# Patient Record
Sex: Female | Born: 1950
Health system: Southern US, Community
[De-identification: ages and names within clinical notes are randomized; demographics above are authoritative.]

## PROBLEM LIST (undated history)

## (undated) DIAGNOSIS — T7840XA Allergy, unspecified, initial encounter: Secondary | ICD-10-CM

## (undated) DIAGNOSIS — A0472 Enterocolitis due to Clostridium difficile, not specified as recurrent: Secondary | ICD-10-CM

## (undated) DIAGNOSIS — I10 Essential (primary) hypertension: Secondary | ICD-10-CM

## (undated) HISTORY — DX: Essential (primary) hypertension: I10

## (undated) HISTORY — PX: TENDON REPAIR: SHX5111

## (undated) HISTORY — PX: LASIK: SHX215

## (undated) HISTORY — PX: REPLACEMENT TOTAL HIP W/  RESURFACING IMPLANTS: SUR1222

## (undated) HISTORY — PX: WISDOM TOOTH EXTRACTION: SHX21

## (undated) HISTORY — DX: Allergy, unspecified, initial encounter: T78.40XA

## (undated) HISTORY — PX: KNEE ARTHROSCOPY: SUR90

## (undated) HISTORY — PX: TONSILLECTOMY: SUR1361

## (undated) HISTORY — PX: CHOLECYSTECTOMY: SHX55

---
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
## 1980-10-26 HISTORY — PX: TENDON REPAIR: SHX5111

## 1990-10-26 HISTORY — PX: HYSTEROSCOPY WITH D & C: SHX1775

## 2009-02-27 HISTORY — PX: COLONOSCOPY: SHX174

## 2016-08-26 DIAGNOSIS — A0472 Enterocolitis due to Clostridium difficile, not specified as recurrent: Secondary | ICD-10-CM

## 2016-08-26 HISTORY — DX: Enterocolitis due to Clostridium difficile, not specified as recurrent: A04.72

## 2017-02-02 DIAGNOSIS — H938X2 Other specified disorders of left ear: Secondary | ICD-10-CM | POA: Insufficient documentation

## 2017-02-02 DIAGNOSIS — H9042 Sensorineural hearing loss, unilateral, left ear, with unrestricted hearing on the contralateral side: Secondary | ICD-10-CM | POA: Insufficient documentation

## 2017-02-02 DIAGNOSIS — H6122 Impacted cerumen, left ear: Secondary | ICD-10-CM | POA: Insufficient documentation

## 2019-08-16 ENCOUNTER — Other Ambulatory Visit: Payer: Self-pay

## 2019-08-16 ENCOUNTER — Encounter: Payer: Self-pay | Admitting: Podiatry

## 2019-08-16 ENCOUNTER — Ambulatory Visit (INDEPENDENT_AMBULATORY_CARE_PROVIDER_SITE_OTHER): Payer: Medicare Other | Admitting: Podiatry

## 2019-08-16 VITALS — BP 121/77 | HR 68 | Resp 16

## 2019-08-16 DIAGNOSIS — B351 Tinea unguium: Secondary | ICD-10-CM | POA: Diagnosis not present

## 2019-08-16 MED ORDER — TERBINAFINE HCL 250 MG PO TABS: ORAL_TABLET | ORAL | 0 refills | Status: DC

## 2019-08-16 NOTE — Progress Notes (Signed)
   Subjective:    Patient ID: Nicole Snow, female    DOB: 03/08/1951, 68 y.o.   MRN: [id number]  HPI    Review of Systems  All other systems reviewed and are negative.      Objective:   Physical Exam        Assessment & Plan:

## 2019-08-16 NOTE — Progress Notes (Signed)
Subjective:   Patient ID: Nicole Snow, female   DOB: 68 y.o.   MRN: [id number]   HPI Patient presents stating she is a discoloration of a number of nail beds and she is very active and likes to play sports.  Patient states they are not sore but she did lose her right big toenail last year and it been an ongoing problem and the other ones are discolored.  Patient does not smoke   Review of Systems  All other systems reviewed and are negative.       Objective:  Physical Exam Vitals signs and nursing note reviewed.  Constitutional:      Appearance: She is well-developed.  Pulmonary:     Effort: Pulmonary effort is normal.  Musculoskeletal: Normal range of motion.  Skin:    General: Skin is warm.  Neurological:     Mental Status: She is alert.     Neurovascular status intact muscle strength found to be adequate range of motion within normal limits.  Patient is noted to have discoloration of nail beds bilateral that are localized with thickness of the right hallux nail which is most likely trauma into orientation.  Patient is found to have good digital perfusion well oriented x3     Assessment:  Low-grade mycotic nail infection with trauma also as precipitating factor     Plan:  H&P all conditions discussed and at this point I did go ahead and I discussed consideration for pulse laser therapy in conjunction with Lamisil pulse and also topical medication.  Educated her on this she wants to go this route pictures taken today and scheduled for treatment with prescription sent to pharmacy

## 2019-08-25 ENCOUNTER — Other Ambulatory Visit: Payer: Self-pay

## 2019-08-25 DIAGNOSIS — B351 Tinea unguium: Secondary | ICD-10-CM

## 2019-09-01 ENCOUNTER — Encounter: Payer: Self-pay | Admitting: Internal Medicine

## 2019-09-01 ENCOUNTER — Other Ambulatory Visit: Payer: Self-pay

## 2019-09-01 ENCOUNTER — Ambulatory Visit (INDEPENDENT_AMBULATORY_CARE_PROVIDER_SITE_OTHER): Payer: Medicare Other | Admitting: Internal Medicine

## 2019-09-01 VITALS — BP 140/90 | HR 67 | Temp 97.8°F | Ht 68.0 in | Wt 118.9 lb

## 2019-09-01 DIAGNOSIS — H6122 Impacted cerumen, left ear: Secondary | ICD-10-CM | POA: Diagnosis not present

## 2019-09-01 DIAGNOSIS — H938X2 Other specified disorders of left ear: Secondary | ICD-10-CM | POA: Diagnosis not present

## 2019-09-01 DIAGNOSIS — Z1231 Encounter for screening mammogram for malignant neoplasm of breast: Secondary | ICD-10-CM

## 2019-09-01 DIAGNOSIS — Z1283 Encounter for screening for malignant neoplasm of skin: Secondary | ICD-10-CM

## 2019-09-01 NOTE — Addendum Note (Signed)
Addended by: Westley Hummer B on: 09/01/2019 10:19 AM   Modules accepted: Orders

## 2019-09-01 NOTE — Patient Instructions (Signed)
-  Nice seeing you today!!  -Schedule follow up visit for your wellness visit.  -Referral to dermatology placed today.

## 2019-09-01 NOTE — Progress Notes (Signed)
New Patient Office Visit     CC/Reason for Visit: Establish care, discuss chronic conditions Previous PCP: [facility] Last Visit: 2019  HPI: Barbe Thorup is a 68 y.o. female who is coming in today for the above mentioned reasons.  She has no past medical history of significance.  She does have some left hearing loss due to continuous cerumen impaction and is requesting ENT referral today.  She would also like a dermatology referral today for annual skin check.  She is retired, she is very healthy she plays pickle ball and lifts weights.  She and her husband moved from [place] to be closer to her daughter who teaches at [employer].  She is a never smoker, drinks 1-1/2 glasses of wine a night.  Family history significant for mother with Alzheimer's dementia and a father with CVA and COPD.  Past Medical/Surgical History: Past Medical History:  Diagnosis Date  . C. difficile colitis     Past Surgical History:  Procedure Laterality Date  . CHOLECYSTECTOMY    . REPLACEMENT TOTAL HIP W/  RESURFACING IMPLANTS      Social History:  reports that she has never smoked. She has never used smokeless tobacco. No history on file for alcohol and drug.  Allergies: Allergies  Allergen Reactions  . Ciprofloxacin Hives  . Clindamycin/Lincomycin Hives  . Flagyl [Metronidazole] Hives  . Hydrocodone Hives  . Penicillins     Family History:  Family History  Problem Relation Age of Onset  . Dementia Mother   . CVA Father   . COPD Father      Current Outpatient Medications:  .  CALCIUM PO, Take 1 tablet by mouth daily., Disp: , Rfl:  .  cetirizine (ZYRTEC) 10 MG tablet, Take by mouth., Disp: , Rfl:  .  fluticasone (FLONASE) 50 MCG/ACT nasal spray, Place into the nose., Disp: , Rfl:  .  Ginkgo 60 MG TABS, Take by mouth., Disp: , Rfl:  .  Multiple Vitamins-Minerals (EMERGEN-C IMMUNE PO), Take by mouth., Disp: , Rfl:  .  Probiotic Product (PROBIOTIC PO), Take  1 tablet by mouth daily., Disp: , Rfl:  .  terbinafine (LAMISIL) 250 MG tablet, Please take one a day x 7days, repeat every 4 weeks x 4 months, Disp: 28 tablet, Rfl: 0 .  Turmeric (QC TUMERIC COMPLEX PO), Take by mouth., Disp: , Rfl:   Review of Systems:  Constitutional: Denies fever, chills, diaphoresis, appetite change and fatigue.  HEENT: Denies photophobia, eye pain, redness, hearing loss, ear pain, congestion, sore throat, rhinorrhea, sneezing, mouth sores, trouble swallowing, neck pain, neck stiffness and tinnitus.   Respiratory: Denies SOB, DOE, cough, chest tightness,  and wheezing.   Cardiovascular: Denies chest pain, palpitations and leg swelling.  Gastrointestinal: Denies nausea, vomiting, abdominal pain, diarrhea, constipation, blood in stool and abdominal distention.  Genitourinary: Denies dysuria, urgency, frequency, hematuria, flank pain and difficulty urinating.  Endocrine: Denies: hot or cold intolerance, sweats, changes in hair or nails, polyuria, polydipsia. Musculoskeletal: Denies myalgias, back pain, joint swelling, arthralgias and gait problem.  Skin: Denies pallor, rash and wound.  Neurological: Denies dizziness, seizures, syncope, weakness, light-headedness, numbness and headaches.  Hematological: Denies adenopathy. Easy bruising, personal or family bleeding history  Psychiatric/Behavioral: Denies suicidal ideation, mood changes, confusion, nervousness, sleep disturbance and agitation    Physical Exam: Vitals:   09/01/19 0858  BP: 140/90  Pulse: 67  Temp: 97.8 F (36.6 C)  TempSrc: Temporal  SpO2: 98%  Weight: 118 lb  14.4 oz (53.9 kg)  Height: 5\' 8"  (1.727 m)   Body mass index is 18.08 kg/m.  Constitutional: NAD, calm, comfortable Eyes: PERRL, lids and conjunctivae normal, wears corrective lenses ENMT: Mucous membranes are moist.  Tympanic membrane is pearly white, no erythema or bulging on the right, left is obstructed by cerumen Neck: normal, supple, no  masses, no thyromegaly Respiratory: clear to auscultation bilaterally, no wheezing, no crackles. Normal respiratory effort. No accessory muscle use.  Cardiovascular: Regular rate and rhythm, no murmurs / rubs / gallops. No extremity edema. 2+ pedal pulses. No carotid bruits.  Abdomen: no tenderness, no masses palpated. No hepatosplenomegaly. Bowel sounds positive.  Skin: no rashes, lesions, ulcers. No induration Neurologic: Grossly intact and nonfocal Psychiatric: Normal judgment and insight. Alert and oriented x 3. Normal mood.    Impression and Plan:  Encounter for screening mammogram for malignant neoplasm of breast  - Plan: MM Digital Screening  Impacted cerumen of left ear Sensation of fullness in left ear -Cerumen Desimpaction  Warm water was applied and gentle ear lavage performed on left ear. There were no complications and following the desimpaction the tympanic membranes were visible. Tympanic membranes are intact following the procedure. Auditory canals are normal. The patient reported relief of symptoms after removal of cerumen.      Patient Instructions  -Nice seeing you today!!  -Schedule follow up visit for your wellness visit.  -Referral to dermatology placed today.     Lelon Frohlich, MD [facility]

## 2019-09-11 ENCOUNTER — Ambulatory Visit
Admission: RE | Admit: 2019-09-11 | Discharge: 2019-09-11 | Disposition: A | Discharge status: Home / Self Care | Payer: Medicare Other | Source: Ambulatory Visit | Attending: Internal Medicine | Admitting: Internal Medicine

## 2019-09-11 ENCOUNTER — Other Ambulatory Visit: Payer: Self-pay

## 2019-09-11 DIAGNOSIS — Z1231 Encounter for screening mammogram for malignant neoplasm of breast: Secondary | ICD-10-CM

## 2019-09-19 ENCOUNTER — Other Ambulatory Visit: Payer: Self-pay | Admitting: Internal Medicine

## 2019-09-19 ENCOUNTER — Encounter: Payer: Self-pay | Admitting: Internal Medicine

## 2019-09-19 DIAGNOSIS — R928 Other abnormal and inconclusive findings on diagnostic imaging of breast: Secondary | ICD-10-CM

## 2019-09-26 ENCOUNTER — Ambulatory Visit: Payer: Medicare Other

## 2019-09-26 ENCOUNTER — Other Ambulatory Visit: Payer: Self-pay

## 2019-09-26 ENCOUNTER — Ambulatory Visit
Admission: RE | Admit: 2019-09-26 | Discharge: 2019-09-26 | Disposition: A | Discharge status: Home / Self Care | Payer: Medicare Other | Source: Ambulatory Visit | Attending: Internal Medicine | Admitting: Internal Medicine

## 2019-09-26 DIAGNOSIS — R928 Other abnormal and inconclusive findings on diagnostic imaging of breast: Secondary | ICD-10-CM

## 2019-09-29 ENCOUNTER — Ambulatory Visit: Payer: Self-pay | Admitting: *Deleted

## 2019-09-29 ENCOUNTER — Other Ambulatory Visit: Payer: Self-pay

## 2019-09-29 DIAGNOSIS — B351 Tinea unguium: Secondary | ICD-10-CM

## 2019-09-29 NOTE — Patient Instructions (Signed)

## 2019-09-29 NOTE — Progress Notes (Signed)
Patient presents today for the 2nd laser treatment. Diagnosed with mycotic nail infection by Dr. Paulla Dolly. The hallux nail right is the most dystrophic.  All other systems are negative.  Nails were filed thin. Laser therapy was administered to 1-5 toenails bilateral and patient tolerated the treatment well. All safety precautions were in place.   Patient is also using a topical antifungal twice a day.  Follow up in 4 weeks for laser # 3.

## 2019-11-03 ENCOUNTER — Ambulatory Visit: Payer: Self-pay | Admitting: *Deleted

## 2019-11-03 ENCOUNTER — Other Ambulatory Visit: Payer: Self-pay

## 2019-11-03 DIAGNOSIS — B351 Tinea unguium: Secondary | ICD-10-CM

## 2019-11-03 NOTE — Progress Notes (Signed)
Patient presents today for the 3rd laser treatment. Diagnosed with mycotic nail infection by Dr. Paulla Dolly. Toenail most affected is the hallux nail right. She does see some clearing and is pleased.  All other systems are negative.  No filing was necessary today. Laser therapy was administered to 1-5 toenails bilateral and patient tolerated the treatment well. All safety precautions were in place.    Follow up in 4 weeks for laser # 4.  Picture of nails taken today to document visual progress

## 2019-11-13 ENCOUNTER — Ambulatory Visit: Payer: Medicare Other | Attending: Internal Medicine

## 2019-11-13 DIAGNOSIS — Z20822 Contact with and (suspected) exposure to covid-19: Secondary | ICD-10-CM

## 2019-11-14 ENCOUNTER — Ambulatory Visit: Payer: Medicare Other | Attending: Internal Medicine

## 2019-11-14 DIAGNOSIS — Z23 Encounter for immunization: Secondary | ICD-10-CM | POA: Insufficient documentation

## 2019-11-14 LAB — NOVEL CORONAVIRUS, NAA: SARS-CoV-2, NAA: NOT DETECTED

## 2019-11-14 NOTE — Progress Notes (Signed)
   Covid-19 Vaccination Clinic  Name:  Nicole Snow    MRN: [id number] DOB: [birth year]  11/14/2019  [name] was observed post Covid-19 immunization for 15 minutes without incidence. She was provided with Vaccine Information Sheet and instruction to access the V-Safe system.   [name] was instructed to call 911 with any severe reactions post vaccine: Marland Kitchen Difficulty breathing  . Swelling of your face and throat  . A fast heartbeat  . A bad rash all over your body  . Dizziness and weakness    Immunizations Administered    Name Date Dose VIS Date Route   Pfizer COVID-19 Vaccine 11/14/2019  4:55 PM 0.3 mL 10/06/2019 Intramuscular   Manufacturer: [place]   Lot: [id number]   [place]: [id number]

## 2019-12-02 ENCOUNTER — Ambulatory Visit: Payer: Medicare Other | Attending: Internal Medicine

## 2019-12-02 DIAGNOSIS — Z23 Encounter for immunization: Secondary | ICD-10-CM

## 2019-12-02 NOTE — Progress Notes (Signed)
   Covid-19 Vaccination Clinic  Name:  Lovia Ferrini    MRN: [id number] DOB: May 22, 1951  12/02/2019  Ms. Hollomon was observed post Covid-19 immunization for 30 minutes based on pre-vaccination screening without incidence. She was provided with Vaccine Information Sheet and instruction to access the V-Safe system.   Ms. Stuver was instructed to call 911 with any severe reactions post vaccine: Marland Kitchen Difficulty breathing  . Swelling of your face and throat  . A fast heartbeat  . A bad rash all over your body  . Dizziness and weakness    Immunizations Administered    Name Date Dose VIS Date Route   Pfizer COVID-19 Vaccine 12/02/2019 12:01 PM 0.3 mL 10/06/2019 Intramuscular   Manufacturer: [place]   Lot: [id number]   [place]: [id number]

## 2019-12-04 ENCOUNTER — Other Ambulatory Visit: Payer: Self-pay

## 2019-12-04 ENCOUNTER — Ambulatory Visit: Payer: Self-pay | Admitting: *Deleted

## 2019-12-04 DIAGNOSIS — B351 Tinea unguium: Secondary | ICD-10-CM

## 2019-12-04 NOTE — Progress Notes (Signed)
Patient presents today for the 4th laser treatment. Diagnosed with mycotic nail infection by Dr. Paulla Dolly. Toenail most affected is the hallux nail right. The hallux nail right is still dystrophic and the nail doesn't seem to be growing, however, the coloration of the nail is better.  All other systems are negative.  Hallux nail right was filed some today. Laser therapy was administered to 1-5 toenails bilateral and patient tolerated the treatment well. All safety precautions were in place.   She is still using a topical antifungal twice a day. She never took the terbinafine pulse dose that was prescribed last fall.  Follow up in 4 weeks for laser # 5.

## 2020-01-05 ENCOUNTER — Other Ambulatory Visit: Payer: Self-pay

## 2020-01-05 ENCOUNTER — Ambulatory Visit (INDEPENDENT_AMBULATORY_CARE_PROVIDER_SITE_OTHER): Payer: Medicare Other | Admitting: *Deleted

## 2020-01-05 DIAGNOSIS — B351 Tinea unguium: Secondary | ICD-10-CM

## 2020-01-05 NOTE — Progress Notes (Signed)
Patient presents today for the 5th laser treatment. Diagnosed with mycotic nail infection by Dr. Paulla Dolly. Toenail most affected is the hallux nail right.   The hallux nail right is still dystrophic and the nail doesn't seem to be growing, however, the coloration of the nail is better.   All other systems are negative.  Hallux nail right was filed some today. Laser therapy was administered to 1-5 toenails bilateral and patient tolerated the treatment well. All safety precautions were in place.   She is still using a topical antifungal twice a day. She never took the terbinafine pulse dose that was prescribed last fall.  Follow up in 8 weeks for laser # 6.  Take pic of nails next visit for final treatment

## 2020-02-07 ENCOUNTER — Encounter: Payer: Self-pay | Admitting: Internal Medicine

## 2020-02-07 ENCOUNTER — Ambulatory Visit (INDEPENDENT_AMBULATORY_CARE_PROVIDER_SITE_OTHER): Payer: Medicare Other | Admitting: Podiatry

## 2020-02-07 ENCOUNTER — Other Ambulatory Visit: Payer: Self-pay

## 2020-02-07 ENCOUNTER — Ambulatory Visit (INDEPENDENT_AMBULATORY_CARE_PROVIDER_SITE_OTHER): Payer: Medicare Other

## 2020-02-07 ENCOUNTER — Other Ambulatory Visit: Payer: Self-pay | Admitting: Podiatry

## 2020-02-07 ENCOUNTER — Encounter: Payer: Self-pay | Admitting: Podiatry

## 2020-02-07 VITALS — Temp 97.7°F

## 2020-02-07 DIAGNOSIS — M779 Enthesopathy, unspecified: Secondary | ICD-10-CM

## 2020-02-07 DIAGNOSIS — Z13820 Encounter for screening for osteoporosis: Secondary | ICD-10-CM

## 2020-02-07 DIAGNOSIS — M79672 Pain in left foot: Secondary | ICD-10-CM

## 2020-02-07 DIAGNOSIS — Z1211 Encounter for screening for malignant neoplasm of colon: Secondary | ICD-10-CM

## 2020-02-07 NOTE — Progress Notes (Signed)
Subjective:   Patient ID: Nicole Snow, female   DOB: 69 y.o.   MRN: [id number]   HPI Patient presents stating that the pain seems to be still present with moderate him provement over where it started but she is concerned about the discomfort as she is very active   ROS      Objective:  Physical Exam  Neurovascular status intact with patient noted to have inflammation forefoot left that is possibly a part of the problem and may be related to shoe gear that was changed in her activity levels     Assessment:  Acute inflammation of the lesser MPJ left mild to moderate in intensity     Plan:  H&P reviewed condition recommended change in shoe gear padding which was applied today oral anti-inflammatories to take and modified activity  X-rays indicate no signs of stress fracture arthritis associated with condition

## 2020-02-26 ENCOUNTER — Telehealth: Payer: Self-pay

## 2020-02-26 NOTE — Telephone Encounter (Signed)
Recv'd records from [facility] forwarded 8 pages to [facility] 5/3/21fbg

## 2020-03-01 ENCOUNTER — Other Ambulatory Visit: Payer: Self-pay

## 2020-03-01 ENCOUNTER — Encounter (INDEPENDENT_AMBULATORY_CARE_PROVIDER_SITE_OTHER): Payer: Self-pay | Admitting: Otolaryngology

## 2020-03-01 ENCOUNTER — Ambulatory Visit (INDEPENDENT_AMBULATORY_CARE_PROVIDER_SITE_OTHER): Payer: Medicare Other | Admitting: Otolaryngology

## 2020-03-01 ENCOUNTER — Other Ambulatory Visit: Payer: Medicare Other

## 2020-03-01 VITALS — Temp 97.9°F

## 2020-03-01 DIAGNOSIS — H6122 Impacted cerumen, left ear: Secondary | ICD-10-CM | POA: Diagnosis not present

## 2020-03-01 NOTE — Progress Notes (Signed)
HPI: Nicole Snow is a 69 y.o. female who presents for evaluation of ear wax buildup on the left side.  She has had this problem for several years and had previously seen ENT in another city.  She does not have problems with the right ear just the left ear.  She is always had a small left ear canal apparently she never had any previous surgery..  Past Medical History:  Diagnosis Date  . C. difficile colitis    Past Surgical History:  Procedure Laterality Date  . CHOLECYSTECTOMY    . REPLACEMENT TOTAL HIP W/  RESURFACING IMPLANTS     Social History   Socioeconomic History  . Marital status: Married    Spouse name: Not on file  . Number of children: Not on file  . Years of education: Not on file  . Highest education level: Not on file  Occupational History  . Not on file  Tobacco Use  . Smoking status: Never Smoker  . Smokeless tobacco: Never Used  Substance and Sexual Activity  . Alcohol use: Not on file  . Drug use: Not on file  . Sexual activity: Not on file  Other Topics Concern  . Not on file  Social History Narrative  . Not on file   Social Determinants of Health   Financial Resource Strain:   . Difficulty of Paying Living Expenses:   Food Insecurity:   . Worried About Charity fundraiser in the Last Year:   . Arboriculturist in the Last Year:   Transportation Needs:   . Film/video editor (Medical):   Marland Kitchen Lack of Transportation (Non-Medical):   Physical Activity:   . Days of Exercise per Week:   . Minutes of Exercise per Session:   Stress:   . Feeling of Stress :   Social Connections:   . Frequency of Communication with Friends and Family:   . Frequency of Social Gatherings with Friends and Family:   . Attends Religious Services:   . Active Member of Clubs or Organizations:   . Attends Archivist Meetings:   Marland Kitchen Marital Status:    Family History  Problem Relation Age of Onset  . Dementia Mother   . CVA Father   . COPD Father     Allergies  Allergen Reactions  . Ciprofloxacin Hives  . Clindamycin/Lincomycin Hives  . Flagyl [Metronidazole] Hives  . Hydrocodone Hives  . Penicillins    Prior to Admission medications   Medication Sig Start Date End Date Taking? Authorizing Provider  CALCIUM PO Take 1 tablet by mouth daily.   Yes [provider]  cetirizine (ZYRTEC) 10 MG tablet Take by mouth.   Yes [provider]  fluticasone (FLONASE) 50 MCG/ACT nasal spray Place into the nose.   Yes [provider]  Ginkgo 60 MG TABS Take by mouth.   Yes [provider]  Multiple Vitamins-Minerals (EMERGEN-C IMMUNE PO) Take by mouth.   Yes [provider]  Probiotic Product (PROBIOTIC PO) Take 1 tablet by mouth daily.   Yes [provider]  Turmeric (QC TUMERIC COMPLEX PO) Take by mouth.   Yes [provider]     Positive ROS: Otherwise negative  All other systems have been reviewed and were otherwise negative with the exception of those mentioned in the HPI and as above.  Physical Exam: Constitutional: Alert, well-appearing, no acute distress Ears: External ears without lesions or tenderness. Ear canals right ear canal and right  TM are clear.  Left ear canal is very narrow and use a nasal speculum in order to open up the ear canal and was able to remove cerumen with a curette.  Ear canal and TM otherwise clear.Marland Kitchen  Her hearing is very good. Nasal: External nose without lesions. Clear nasal passages Oral: Oropharynx clear. Neck: No palpable adenopathy or masses Respiratory: Breathing comfortably  Skin: No facial/neck lesions or rash noted.  Cerumen impaction removal  Date/Time: 03/01/2020 6:07 PM Performed by: Rozetta Nunnery, MD Authorized by: Rozetta Nunnery, MD   Consent:    Consent obtained:  Verbal   Consent given by:  Patient   Risks discussed:  Pain and bleeding Procedure details:    Location:  L ear   Procedure type: curette    Post-procedure details:    Inspection:  TM intact and canal normal   Hearing quality:  Improved   Patient tolerance of procedure:  Tolerated well, no immediate complications Comments:     Patient with a very narrowed left ear canal.  The TM is clear.    Assessment: Left ear cerumen buildup with very narrowed left ear canal  Plan: Briefly discussed possible surgical options to open up the ear canal but she is really not interested in surgery.  She generally only needs to have this cleaned about once a year.  Radene Journey, MD

## 2020-03-08 ENCOUNTER — Other Ambulatory Visit: Payer: Self-pay

## 2020-03-08 ENCOUNTER — Ambulatory Visit (INDEPENDENT_AMBULATORY_CARE_PROVIDER_SITE_OTHER): Payer: Medicare Other | Admitting: *Deleted

## 2020-03-08 DIAGNOSIS — B351 Tinea unguium: Secondary | ICD-10-CM

## 2020-03-08 NOTE — Progress Notes (Signed)
Patient presents today for the 6th laser treatment. Diagnosed with mycotic nail infection by Dr. Paulla Dolly. Toenail most affected is the hallux nail right.   The hallux nail right is still dystrophic and the nail doesn't seem to be growing, however, the coloration of the nail is better. She is pleased with the progress her nails have made so far.  All other systems are negative.  Hallux nail right was filed some today. Laser therapy was administered to 1-5 toenails bilateral and patient tolerated the treatment well. All safety precautions were in place.   She is still using a topical antifungal twice a day. She never took the terbinafine pulse dose that was prescribed last fall.  Patient has completed the recommended laser treatments. She will follow up with Dr. Paulla Dolly in 3 months to evaluate progress.   Final pic of nails were taken today

## 2020-03-18 ENCOUNTER — Telehealth: Payer: Self-pay | Admitting: Gastroenterology

## 2020-03-18 NOTE — Telephone Encounter (Signed)
She had a colonoscopy in 2010 with removal of small adenoma. She is due for surveillance, okay to direct book at [facility] if no major comorbidities. Thanks

## 2020-03-18 NOTE — Telephone Encounter (Signed)
DOD 02/12/20  Dr. Havery Moros, this patient was referred for a colonoscopy.  Her previous colon and path report will be sent to you for review.  Please advise scheduling.

## 2020-03-19 ENCOUNTER — Encounter: Payer: Self-pay | Admitting: Gastroenterology

## 2020-03-19 NOTE — Telephone Encounter (Signed)
Recv'd records from [facility]/Dr. Holtzmuller forwarded 22 pages to Dr. Nicki Guadalajara 5/25/21fbg

## 2020-03-21 ENCOUNTER — Ambulatory Visit (INDEPENDENT_AMBULATORY_CARE_PROVIDER_SITE_OTHER): Payer: Medicare Other | Admitting: Sports Medicine

## 2020-03-21 ENCOUNTER — Other Ambulatory Visit: Payer: Self-pay

## 2020-03-21 VITALS — BP 138/84 | Ht 68.0 in | Wt 115.0 lb

## 2020-03-21 DIAGNOSIS — M67952 Unspecified disorder of synovium and tendon, left thigh: Secondary | ICD-10-CM | POA: Diagnosis present

## 2020-03-21 NOTE — Progress Notes (Signed)
   Subjective:    Patient ID: Nicole Snow, female    DOB: [birth year], 69 y.o.   MRN: [id number]  HPI Nicole Snow is a 69 year old female with history if right hip replacement 6.5 years ago that presents to clinic for lateral left hip pain that started late last week.   She reports that the pain is in the lateral hip and describes it as a dull ache rather than a sharp or stabbing pain. It mostly bothers her at night when she is trying to sleep. The pain does not radiate but she does report tingling sensation to the knee. No groin pain associated. No swelling or known trauma. Reports that this pain occurs episodically every 4-5 years. Does not feel like the osteoarthritis she experienced in her right hip. Went to chiropractor on Monday and feels 75-80% improved.   Plays pickleball 4-5x per week and does strength training workouts at least 3 times weekly.  Played in a tournament on Tuesday without any issues. Has not had to take any pain medication.    Review of Systems As per HPI    Objective:   Physical Exam General: well-appearing, in no acute distress HEENT: atraumatic and normocephalic CV: no cyanosis, well perfused Resp: comfortable work of breathing  Left lower extremity: No tenderness to palpation of hip. No swelling or deformity. Mild limitation in internal range of motion of left hip. Good hip abductor strength. +Gluteal medius weakness.      Assessment & Plan:  Nicole Snow is a 69 year old female that presented to clinic with left lateral hip pain of 1 week duration that was improving prior to visit.   1. Tendinopathy of left gluteus medius Discussed that weakness of gluteus medius can result in hip pain Demonstrated pelvic floor strengthening exercises and provided handout Can continue pickle ball and strength training if asymptomatic  Will follow up in 6 weeks to see if symptoms improved and if not, determine if imaging is warranted  Dorna Leitz,  MD [facility] PGY-3  Patient seen and evaluated with the resident.  I agree with the above plan of care.  Although the patient may have some arthritis in the left hip given her decrease in internal rotation, I believe her pain is originating from a week left gluteus medius tendon.  She will start pelvic stabilizer exercises and will follow up in 6 weeks.  Of note, she is status post right hip arthroplasty several years ago and states that her current left hip pain is different in nature than what she experienced with the arthritis in the right hip.

## 2020-04-30 ENCOUNTER — Other Ambulatory Visit: Payer: Self-pay

## 2020-04-30 ENCOUNTER — Ambulatory Visit (INDEPENDENT_AMBULATORY_CARE_PROVIDER_SITE_OTHER): Payer: Medicare Other | Admitting: Sports Medicine

## 2020-04-30 ENCOUNTER — Ambulatory Visit (AMBULATORY_SURGERY_CENTER): Payer: Self-pay | Admitting: *Deleted

## 2020-04-30 VITALS — BP 136/84 | Ht 67.0 in | Wt 114.0 lb

## 2020-04-30 VITALS — Ht 67.0 in | Wt 113.6 lb

## 2020-04-30 DIAGNOSIS — M67952 Unspecified disorder of synovium and tendon, left thigh: Secondary | ICD-10-CM | POA: Insufficient documentation

## 2020-04-30 DIAGNOSIS — Z86010 Personal history of colonic polyps: Secondary | ICD-10-CM

## 2020-04-30 MED ORDER — SUTAB 1479-225-188 MG PO TABS: 1.0000 | ORAL_TABLET | ORAL | 0 refills | Status: DC

## 2020-04-30 MED ORDER — METHOCARBAMOL 500 MG PO TABS: 500.0000 mg | ORAL_TABLET | Freq: Every day | ORAL | 1 refills | Status: DC

## 2020-04-30 NOTE — Patient Instructions (Addendum)
Thank you for coming in to see [place] today! Please see below to review our plan for today's visit:  1. Physical Therapy 2. Take muscle relaxer Robaxin 500mg  every night before bed.  3. Reduce Pickle ball to every other day. 4. Follow up in 4 weeks  Please call the clinic at [phone] if your symptoms worsen or you have any concerns. It was our pleasure to serve you!  Dr. Everlean Alstrom Dr. Milus Banister [facility]

## 2020-04-30 NOTE — Progress Notes (Signed)
Patient denies any allergies to egg or soy products. Patient denies complications with anesthesia/sedation.  Patient denies oxygen use at home and denies diet medications. Emmi instructions for colonoscopy explained and given to patient.  Patient had both covid vaccinations.  

## 2020-04-30 NOTE — Progress Notes (Signed)
°  Nicole Snow - 69 y.o. female MRN [id number]  Date of birth: 02/21/1951    SUBJECTIVE:      Chief Complaint:/ HPI:  This is a pleasant patient presenting for follow-up of her left sided hip pain due to left gluteus medius tendinopathy.  She reports the left-sided hip pain is often waking up at night around 3:00 every morning.  She also occasionally has left IT band pain and left hip pain that radiates towards the groin.  Denies any loss of bladder or bowel function.  She reports that she feels about 10% better since her previous visit.  Since her last visit she has been performing her at home exercises.  She also recently made the decision to decrease her pickleball playing from daily to every other day.  She got a massage about 1 week ago reports she felt great for about 24 hours.  Sitting still and sleeping makes the left hip pain worse, hip pain is reduced with exercise.   ROS:     See HPI  PERTINENT  PMH / PSH / FH / SH:  Past Medical, Surgical, Social, and Family History Reviewed & Updated in the EMR.  Pertinent findings include:  Patient Active Problem List   Diagnosis Date Noted   Tendinopathy of left gluteus medius 04/30/2020   Impacted cerumen of left ear 02/02/2017   Sensation of fullness in left ear 02/02/2017   Sensorineural hearing loss (SNHL) of left ear with unrestricted hearing of right ear 02/02/2017    OBJECTIVE: BP 136/84    Ht 5\' 7"  (1.702 m)    Wt 114 lb (51.7 kg)    LMP  (LMP Unknown)    BMI 17.85 kg/m   Physical Exam:  Vital signs are reviewed.  GEN: Alert and oriented, NAD Pulm: Breathing unlabored PSY: normal mood, congruent affect MSK: Left hip: No gross abnormalities or deformities on inspection; tenderness to palpation located on gluteus medius range of motion in left hip external rotation, internal rotation, flexion, extension is reduced (hypertonic hip musculature appreciated during range of motion exercises); strength testing 5/5 in left  hip extension, flexion and abduction; negative FABER and FADIR year; positive left Trendelenburg Right hip: No gross abnormalities deformity is on inspection; no tenderness to palpation; full range of motion appreciated in all planes; strength 5/5; negative FABER and FADIR year   ASSESSMENT & PLAN: 1.Tendinopathy of left gluteus medius: 10% improved from previous visit -Physical Therapy -Take muscle relaxer Robaxin 500mg  every night before bed.  -Continue reduced pickle ball playing to every other day. -Continue home pelvic floor strengthening exercises -Follow up in 4 weeks -If symptoms change can consider imaging in the future   Milus Banister, [place], PGY-3 04/30/2020 3:51 PM  Patient seen and evaluated with the resident.  I agree with the above plan of care.  I believe the patient is overdoing it by playing pickle ball every day.  She will modify her play to every other day.  I would also like for her to start physical therapy with Barbaraann Barthel.  We will try a muscle relaxer at night and she will follow up with me again in 4 weeks.  If she would like, she may follow-up via email or telephone instead of in the office.  If symptoms do not improve then I would consider imaging including x-ray and limited hip ultrasound.

## 2020-05-01 ENCOUNTER — Ambulatory Visit (INDEPENDENT_AMBULATORY_CARE_PROVIDER_SITE_OTHER): Payer: Medicare Other | Admitting: Physical Therapy

## 2020-05-01 ENCOUNTER — Encounter: Payer: Self-pay | Admitting: Physical Therapy

## 2020-05-01 DIAGNOSIS — M62838 Other muscle spasm: Secondary | ICD-10-CM

## 2020-05-01 DIAGNOSIS — M25552 Pain in left hip: Secondary | ICD-10-CM | POA: Diagnosis not present

## 2020-05-05 ENCOUNTER — Encounter: Payer: Self-pay | Admitting: Physical Therapy

## 2020-05-05 NOTE — Therapy (Signed)
[facility] [address] [place], [place], [address] Phone: [phone]   Fax:  [phone]  Physical Therapy Evaluation  Patient Details  Name: Nicole Snow MRN: [id number] Date of Birth: [birth year] Referring Provider (PT): Everlean Alstrom   Encounter Date: 05/01/2020   PT End of Session - 05/05/20 1625    Visit Number 1    Number of Visits 12    Date for PT Re-Evaluation 06/12/20    Authorization Type Medicare    PT Start Time 1347    PT Stop Time 1440    PT Time Calculation (min) 53 min    Activity Tolerance Patient tolerated treatment well    Behavior During Therapy [facility] for tasks assessed/performed           Past Medical History:  Diagnosis Date  . Allergy   . C. difficile colitis 08/2016    Past Surgical History:  Procedure Laterality Date  . CHOLECYSTECTOMY    . COLONOSCOPY  02/27/2009   T A polyp  . HYSTEROSCOPY WITH D & C  1992  . LASIK Bilateral   . REPLACEMENT TOTAL HIP W/  RESURFACING IMPLANTS    . TENDON REPAIR Right 69  . TENDON REPAIR     left hand ring finge at age 69 yr old  . TONSILLECTOMY    . WISDOM TOOTH EXTRACTION      There were no vitals filed for this visit.    Subjective Assessment - 05/05/20 1622    Subjective Pt states increased pain in L hip. Has had previous R TKA/fine. States pain with resting, sitting, laying down, improved pain with movement. Plays pickleball, every day, 3 hrs/day. Also goes to gym, does LE strengthening machines.    Limitations Sitting;Standing;Walking;Writing;House hold activities    Patient Stated Goals decreased pain    Currently in Pain? Yes    Pain Score 6     Pain Location Hip    Pain Orientation Left    Pain Descriptors / Indicators Aching    Pain Type Acute pain    Pain Onset More than a month ago    Pain Frequency Intermittent    Aggravating Factors  first thing in AM: 6/10 ,  better with movement              [facility] PT Assessment -  05/05/20 0001      Assessment   Medical Diagnosis L hip pain    Referring Provider (PT) Everlean Alstrom    Prior Therapy NO      Balance Screen   Has the patient fallen in the past 6 months No      Prior Function   Level of Independence Independent      Cognition   Overall Cognitive Status Within Functional Limits for tasks assessed      Posture/Postural Control   Posture Comments Standing: hip height appears even,  Supine: L LE longer than R; (did have THA on R)       AROM   Overall AROM Comments HIps: mild limtation for IR/ER bilaterally,  Lumbar: WFL,       Strength   Left Hip Flexion 4/5    Left Hip Extension 4/5    Left Hip External Rotation 4/5    Left Hip Internal Rotation 4/5    Left Hip ABduction 4-/5      Palpation   Palpation comment Pain in L glute med, min, and piriformis, no pain to palpate lumbar spine or SI.  Special Tests   Other special tests Neg SLR, Neg Fadir/Fabir, No deep joint pain or groin pain.                       Objective measurements completed on examination: See above findings.       [facility] Adult PT Treatment/Exercise - 05/05/20 0001      Exercises   Exercises Knee/Hip      Knee/Hip Exercises: Stretches   Piriformis Stretch 2 reps;30 seconds    Piriformis Stretch Limitations supine mod fig 4       Knee/Hip Exercises: Sidelying   Hip ABduction 10 reps;Left      Modalities   Modalities Moist Heat      Moist Heat Therapy   Number Minutes Moist Heat 10 Minutes    Moist Heat Location Hip      Manual Therapy   Manual Therapy Joint mobilization;Soft tissue mobilization;Passive ROM    Manual therapy comments skilled palpation and monitoring of soft tissue with dry needling    Soft tissue mobilization DTM/IASTM to L glute and hip            Trigger Point Dry Needling - 05/05/20 0001    Consent Given? Yes    Education Handout Provided Yes    Muscles Treated Back/Hip Gluteus medius;Gluteus minimus;Piriformis     Gluteus Minimus Response Palpable increased muscle length   L   Gluteus Medius Response Palpable increased muscle length   L   Piriformis Response Palpable increased muscle length   L               PT Education - 05/05/20 1625    Education Details HEP, PT POC, Exam findings.    Person(s) Educated Patient    Methods Explanation;Demonstration;Tactile cues;Verbal cues;Handout    Comprehension Verbalized understanding;Returned demonstration;Verbal cues required;Tactile cues required;Need further instruction            PT Short Term Goals - 05/05/20 1627      PT SHORT TERM GOAL #1   Title Pt to be independent with initial HEP    Time 2    Period Weeks    Status New    Target Date 05/15/20      PT SHORT TERM GOAL #2   Title Pt to report decreased pain in L hip to 0-4/10    Time 2    Period Weeks    Status New    Target Date 05/15/20             PT Long Term Goals - 05/05/20 1629      PT LONG TERM GOAL #1   Title Pt to be independent with final HEP    Time 2    Period Weeks    Status New    Target Date 06/12/20      PT LONG TERM GOAL #2   Title Pt to report decreased pain in L hip to be 0-1/10 with activity and sitting for at least 30 min    Time 6    Period Weeks    Status New    Target Date 06/12/20      PT LONG TERM GOAL #3   Title Pt to demo improved strength of Bil hips to at least 4+/5 to improve stability and pain    Time 6    Period Weeks    Status New    Target Date 06/12/20  Plan - 05/05/20 1631    Clinical Impression Statement Pt presents with primary complaint of increased pain in L hip. Pt is very active, has good ROM of hips, but mild strength and stability deficits. Pt with increased trigger points and tenderness in L glute musculature. DN and DTM done today to improve. Pt with decreased ability for full functional activities, as well as resing motions. Pt with lack of effective HEP for Dx. Pt to benefit from skilled  PT to improve pain and deficits.    Examination-Activity Limitations Transfers;Bend;Sit;Sleep;Squat;Stand;Lift;Stairs    Examination-Participation Restrictions Yard Work;Cleaning;Community Activity    Stability/Clinical Decision Making Stable/Uncomplicated    Clinical Decision Making Low    Rehab Potential Good    PT Frequency 2x / week    PT Duration 6 weeks    PT Treatment/Interventions ADLs/Self Care Home Management;Cryotherapy;Electrical Stimulation;DME Instruction;Ultrasound;Traction;Moist Heat;Iontophoresis 4mg /ml Dexamethasone;Gait training;Stair training;Functional mobility training;Therapeutic activities;Therapeutic exercise;Balance training;Patient/family education;Neuromuscular re-education;Manual techniques;Taping;Dry needling;Passive range of motion;Spinal Manipulations;Joint Manipulations    Consulted and Agree with Plan of Care Patient           Patient will benefit from skilled therapeutic intervention in order to improve the following deficits and impairments:  Pain, Increased muscle spasms, Decreased mobility, Decreased activity tolerance, Decreased range of motion, Decreased strength  Visit Diagnosis: Pain in left hip  Other muscle spasm     Problem List Patient Active Problem List   Diagnosis Date Noted  . Tendinopathy of left gluteus medius 04/30/2020  . Impacted cerumen of left ear 02/02/2017  . Sensation of fullness in left ear 02/02/2017  . Sensorineural hearing loss (SNHL) of left ear with unrestricted hearing of right ear 02/02/2017    Lyndee Hensen, PT, DPT 4:40 PM  05/05/20    [facility] [place], [place], [address] Phone: [phone]   Fax:  [phone]  Name: Nicole Snow MRN: [id number] Date of Birth: 1950/12/06

## 2020-05-06 ENCOUNTER — Encounter: Payer: Self-pay | Admitting: Physical Therapy

## 2020-05-06 ENCOUNTER — Ambulatory Visit (INDEPENDENT_AMBULATORY_CARE_PROVIDER_SITE_OTHER): Payer: Medicare Other | Admitting: Physical Therapy

## 2020-05-06 ENCOUNTER — Other Ambulatory Visit: Payer: Self-pay

## 2020-05-06 DIAGNOSIS — M62838 Other muscle spasm: Secondary | ICD-10-CM

## 2020-05-06 DIAGNOSIS — M25552 Pain in left hip: Secondary | ICD-10-CM

## 2020-05-06 NOTE — Therapy (Signed)
[facility] [address] [place], [place], [address] Phone: [phone]   Fax:  [phone]  Physical Therapy Treatment  Patient Details  Name: Nicole Snow MRN: [id number] Date of Birth: 1951/07/06 Referring Provider (PT): Everlean Alstrom   Encounter Date: 05/06/2020   PT End of Session - 05/06/20 0855    Visit Number 2    Number of Visits 12    Date for PT Re-Evaluation 06/12/20    Authorization Type Medicare    PT Start Time 0845    PT Stop Time 0930    PT Time Calculation (min) 45 min    Activity Tolerance Patient tolerated treatment well    Behavior During Therapy [facility] for tasks assessed/performed           Past Medical History:  Diagnosis Date  . Allergy   . C. difficile colitis 08/2016    Past Surgical History:  Procedure Laterality Date  . CHOLECYSTECTOMY    . COLONOSCOPY  02/27/2009   T A polyp  . HYSTEROSCOPY WITH D & C  1992  . LASIK Bilateral   . REPLACEMENT TOTAL HIP W/  RESURFACING IMPLANTS    . TENDON REPAIR Right 1982  . TENDON REPAIR     left hand ring finge at age 21 yr old  . TONSILLECTOMY    . WISDOM TOOTH EXTRACTION      There were no vitals filed for this visit.   Subjective Assessment - 05/06/20 0855    Subjective Pt reports improvements, did not wake up with pain, feels less stiff in AM.    Patient Stated Goals decreased pain    Currently in Pain? Yes    Pain Score 3     Pain Location Hip    Pain Orientation Left    Pain Descriptors / Indicators Aching    Pain Type Acute pain    Pain Onset More than a month ago    Pain Frequency Intermittent                             [facility] Adult PT Treatment/Exercise - 05/06/20 0856      Posture/Postural Control   Posture Comments Standing: hip height appears even,  Supine: L LE longer than R; (did have THA on R)       Exercises   Exercises Knee/Hip      Knee/Hip Exercises: Stretches   Piriformis Stretch 2 reps;30  seconds    Piriformis Stretch Limitations supine mod fig 4     Other Knee/Hip Stretches SKTC 30 sec x 2 L       Knee/Hip Exercises: Aerobic   Recumbent Bike L 2 x 8 min;       Knee/Hip Exercises: Standing   Hip Abduction 2 sets;10 reps;Both      Knee/Hip Exercises: Supine   Hip Adduction Isometric 10 reps    Bridges 10 reps      Knee/Hip Exercises: Sidelying   Hip ABduction Left;20 reps    Clams x20 L       Modalities   Modalities --      Moist Heat Therapy   Moist Heat Location --      Manual Therapy   Manual Therapy Joint mobilization;Soft tissue mobilization;Passive ROM    Manual therapy comments --    Joint Mobilization inf and post hip mobs    Soft tissue mobilization DTM to L glute and hip  PT Short Term Goals - 05/05/20 1627      PT SHORT TERM GOAL #1   Title Pt to be independent with initial HEP    Time 2    Period Weeks    Status New    Target Date 05/15/20      PT SHORT TERM GOAL #2   Title Pt to report decreased pain in L hip to 0-4/10    Time 2    Period Weeks    Status New    Target Date 05/15/20             PT Long Term Goals - 05/05/20 1629      PT LONG TERM GOAL #1   Title Pt to be independent with final HEP    Time 2    Period Weeks    Status New    Target Date 06/12/20      PT LONG TERM GOAL #2   Title Pt to report decreased pain in L hip to be 0-1/10 with activity and sitting for at least 30 min    Time 6    Period Weeks    Status New    Target Date 06/12/20      PT LONG TERM GOAL #3   Title Pt to demo improved strength of Bil hips to at least 4+/5 to improve stability and pain    Time 6    Period Weeks    Status New    Target Date 06/12/20                 Plan - 05/06/20 0942    Clinical Impression Statement Pt wtih decreaesd pain and tenderness noted today. Progressed ther ex for strengthening, HEP updated. Noted stiffness with hip flexion ROM today as well. Plan to progress strength  and stabilization as tolerated.    Examination-Activity Limitations Transfers;Bend;Sit;Sleep;Squat;Stand;Lift;Stairs    Examination-Participation Restrictions Yard Work;Cleaning;Community Activity    Stability/Clinical Decision Making Stable/Uncomplicated    Rehab Potential Good    PT Frequency 2x / week    PT Duration 6 weeks    PT Treatment/Interventions ADLs/Self Care Home Management;Cryotherapy;Electrical Stimulation;DME Instruction;Ultrasound;Traction;Moist Heat;Iontophoresis 4mg /ml Dexamethasone;Gait training;Stair training;Functional mobility training;Therapeutic activities;Therapeutic exercise;Balance training;Patient/family education;Neuromuscular re-education;Manual techniques;Taping;Dry needling;Passive range of motion;Spinal Manipulations;Joint Manipulations    Consulted and Agree with Plan of Care Patient           Patient will benefit from skilled therapeutic intervention in order to improve the following deficits and impairments:  Pain, Increased muscle spasms, Decreased mobility, Decreased activity tolerance, Decreased range of motion, Decreased strength  Visit Diagnosis: Pain in left hip  Other muscle spasm     Problem List Patient Active Problem List   Diagnosis Date Noted  . Tendinopathy of left gluteus medius 04/30/2020  . Impacted cerumen of left ear 02/02/2017  . Sensation of fullness in left ear 02/02/2017  . Sensorineural hearing loss (SNHL) of left ear with unrestricted hearing of right ear 02/02/2017    Lyndee Hensen, PT, DPT 9:43 AM  05/06/20    [facility] [place], [place], [address] Phone: [phone]   Fax:  [phone]  Name: Nicole Snow MRN: [id number] Date of Birth: 07-19-1951

## 2020-05-08 ENCOUNTER — Ambulatory Visit (INDEPENDENT_AMBULATORY_CARE_PROVIDER_SITE_OTHER): Payer: Medicare Other | Admitting: Physical Therapy

## 2020-05-08 ENCOUNTER — Other Ambulatory Visit: Payer: Self-pay

## 2020-05-08 ENCOUNTER — Encounter: Payer: Self-pay | Admitting: Physical Therapy

## 2020-05-08 DIAGNOSIS — M25552 Pain in left hip: Secondary | ICD-10-CM

## 2020-05-08 NOTE — Patient Instructions (Signed)
Access Code: [email or web address] URL: [email or web address] Date: 05/08/2020 Prepared by: Lyndee Hensen  Exercises Supine Bridge - 1 x daily - 2 sets - 10 reps Hooklying Isometric Clamshell - 1 x daily - 2 sets - 10 reps Clamshell - 1 x daily - 2 sets - 10 reps Sidelying Hip Abduction - 1 x daily - 2 sets - 10 reps Standing Repeated Hip Abduction with Resistance - 1 x daily - 2 sets - 10 reps Standing Hip Extension with Anchored Resistance - 1 x daily - 2 sets - 10 reps Supine Piriformis Stretch - 2 x daily - 3 reps - 30 hold Seated Piriformis Stretch with Trunk Bend - 2 x daily - 3 reps - 30 hold Supine Single Knee to Chest Stretch - 2 x daily - 3 reps - 30 hold

## 2020-05-08 NOTE — Therapy (Signed)
[facility] [address] [place], [place], [address] Phone: [phone]   Fax:  [phone]  Physical Therapy Treatment  Patient Details  Name: Nicole Snow MRN: [id number] Date of Birth: 1951-01-14 Referring Provider (PT): Everlean Alstrom   Encounter Date: 05/08/2020   PT End of Session - 05/08/20 1355    Visit Number 3    Number of Visits 12    Date for PT Re-Evaluation 06/12/20    Authorization Type Medicare    PT Start Time 1347    PT Stop Time 1430    PT Time Calculation (min) 43 min    Activity Tolerance Patient tolerated treatment well    Behavior During Therapy [facility] for tasks assessed/performed           Past Medical History:  Diagnosis Date  . Allergy   . C. difficile colitis 08/2016    Past Surgical History:  Procedure Laterality Date  . CHOLECYSTECTOMY    . COLONOSCOPY  02/27/2009   T A polyp  . HYSTEROSCOPY WITH D & C  1992  . LASIK Bilateral   . REPLACEMENT TOTAL HIP W/  RESURFACING IMPLANTS    . TENDON REPAIR Right 1982  . TENDON REPAIR     left hand ring finge at age 89 yr old  . TONSILLECTOMY    . WISDOM TOOTH EXTRACTION      There were no vitals filed for this visit.   Subjective Assessment - 05/08/20 1352    Subjective Pt states only mild soreness today.    Patient Stated Goals decreased pain    Currently in Pain? Yes    Pain Score 2     Pain Location Hip    Pain Orientation Left    Pain Descriptors / Indicators Aching    Pain Type Acute pain    Pain Onset More than a month ago    Pain Frequency Intermittent                             [facility] Adult PT Treatment/Exercise - 05/08/20 1357      Posture/Postural Control   Posture Comments Standing: hip height appears even,  Supine: L LE longer than R; (did have THA on R)       Exercises   Exercises Knee/Hip      Knee/Hip Exercises: Stretches   Piriformis Stretch 2 reps;30 seconds    Piriformis Stretch Limitations  supine mod fig 4     Other Knee/Hip Stretches --      Knee/Hip Exercises: Aerobic   Recumbent Bike L 2 x 8 min;       Knee/Hip Exercises: Standing   Hip Abduction 2 sets;10 reps;Both    Abduction Limitations YTB    Hip Extension 2 sets;10 reps    Extension Limitations YTB      Knee/Hip Exercises: Supine   Hip Adduction Isometric --    Constance Haw --    Bridges with Clamshell 20 reps    Other Supine Knee/Hip Exercises Clams GTB alternating x 20;       Knee/Hip Exercises: Sidelying   Hip ABduction Left;20 reps    Clams --      Manual Therapy   Manual Therapy Joint mobilization;Soft tissue mobilization;Passive ROM    Joint Mobilization inf and post hip mobs    Soft tissue mobilization DTM and IASTM to L glute and hip  PT Short Term Goals - 05/05/20 1627      PT SHORT TERM GOAL #1   Title Pt to be independent with initial HEP    Time 2    Period Weeks    Status New    Target Date 05/15/20      PT SHORT TERM GOAL #2   Title Pt to report decreased pain in L hip to 0-4/10    Time 2    Period Weeks    Status New    Target Date 05/15/20             PT Long Term Goals - 05/05/20 1629      PT LONG TERM GOAL #1   Title Pt to be independent with final HEP    Time 2    Period Weeks    Status New    Target Date 06/12/20      PT LONG TERM GOAL #2   Title Pt to report decreased pain in L hip to be 0-1/10 with activity and sitting for at least 30 min    Time 6    Period Weeks    Status New    Target Date 06/12/20      PT LONG TERM GOAL #3   Title Pt to demo improved strength of Bil hips to at least 4+/5 to improve stability and pain    Time 6    Period Weeks    Status New    Target Date 06/12/20                 Plan - 05/08/20 1428    Clinical Impression Statement Pt challenged with strengthening exercises, but improving with ability, as well as pain. Tenderness in L glute with manual today, may benefit from additional dry  needling for tightness/ pain. Plan to progress strength and stabilization as tolerated.    Examination-Activity Limitations Transfers;Bend;Sit;Sleep;Squat;Stand;Lift;Stairs    Examination-Participation Restrictions Yard Work;Cleaning;Community Activity    Stability/Clinical Decision Making Stable/Uncomplicated    Rehab Potential Good    PT Frequency 2x / week    PT Duration 6 weeks    PT Treatment/Interventions ADLs/Self Care Home Management;Cryotherapy;Electrical Stimulation;DME Instruction;Ultrasound;Traction;Moist Heat;Iontophoresis 4mg /ml Dexamethasone;Gait training;Stair training;Functional mobility training;Therapeutic activities;Therapeutic exercise;Balance training;Patient/family education;Neuromuscular re-education;Manual techniques;Taping;Dry needling;Passive range of motion;Spinal Manipulations;Joint Manipulations    PT Home Exercise Plan [email or web address]    Consulted and Agree with Plan of Care Patient           Patient will benefit from skilled therapeutic intervention in order to improve the following deficits and impairments:  Pain, Increased muscle spasms, Decreased mobility, Decreased activity tolerance, Decreased range of motion, Decreased strength  Visit Diagnosis: Pain in left hip     Problem List Patient Active Problem List   Diagnosis Date Noted  . Tendinopathy of left gluteus medius 04/30/2020  . Impacted cerumen of left ear 02/02/2017  . Sensation of fullness in left ear 02/02/2017  . Sensorineural hearing loss (SNHL) of left ear with unrestricted hearing of right ear 02/02/2017    Lyndee Hensen, PT, DPT 9:17 PM  05/08/20    [facility] [place], [place], [address] Phone: [phone]   Fax:  [phone]  Name: Stewart Pimenta MRN: [id number] Date of Birth: [birth year]

## 2020-05-14 ENCOUNTER — Encounter: Payer: Self-pay | Admitting: Gastroenterology

## 2020-05-14 ENCOUNTER — Ambulatory Visit (AMBULATORY_SURGERY_CENTER): Payer: Medicare Other | Admitting: Gastroenterology

## 2020-05-14 ENCOUNTER — Other Ambulatory Visit: Payer: Self-pay

## 2020-05-14 VITALS — BP 139/79 | HR 51 | Temp 96.8°F | Resp 14 | Ht 67.0 in | Wt 113.0 lb

## 2020-05-14 DIAGNOSIS — D122 Benign neoplasm of ascending colon: Secondary | ICD-10-CM

## 2020-05-14 DIAGNOSIS — Z86010 Personal history of colonic polyps: Secondary | ICD-10-CM

## 2020-05-14 MED ORDER — SODIUM CHLORIDE 0.9 % IV SOLN: 500.0000 mL | Freq: Once | INTRAVENOUS | Status: DC

## 2020-05-14 NOTE — Progress Notes (Signed)
pt tolerated well. VSS. awake and to recovery. Report given to RN.  

## 2020-05-14 NOTE — Op Note (Signed)
[facility] Patient Name: [place] Procedure Date: 05/14/2020 8:15 AM MRN: [id number] Endoscopist: Remo Lipps P. Havery Moros , MD Age: 69 Referring MD:  Date of Birth: 1951/06/04 Gender: Female Account #: [id number] Procedure:                Colonoscopy Indications:              High risk colon cancer surveillance: Personal                            history of colonic polyps (adenoma removed 10 years                            ago) Medicines:                Monitored Anesthesia Care Procedure:                Pre-Anesthesia Assessment:                           - Prior to the procedure, a History and Physical                            was performed, and patient medications and                            allergies were reviewed. The patient's tolerance of                            previous anesthesia was also reviewed. The risks                            and benefits of the procedure and the sedation                            options and risks were discussed with the patient.                            All questions were answered, and informed consent                            was obtained. Prior Anticoagulants: The patient has                            taken no previous anticoagulant or antiplatelet                            agents. ASA Grade Assessment: I - A normal, healthy                            patient. After reviewing the risks and benefits,                            the patient was deemed in satisfactory condition to  undergo the procedure.                           After obtaining informed consent, the colonoscope                            was passed under direct vision. Throughout the                            procedure, the patient's blood pressure, pulse, and                            oxygen saturations were monitored continuously. The                            Colonoscope was introduced through the anus and                             advanced to the the cecum, identified by                            appendiceal orifice and ileocecal valve. The                            patient tolerated the procedure well. The                            colonoscopy was technically difficult and complex                            due to a tortuous colon. Scope In: 8:28:53 AM Scope Out: 8:59:57 AM Scope Withdrawal Time: 0 hours 19 minutes 38 seconds  Total Procedure Duration: 0 hours 31 minutes 4 seconds  Findings:                 Skin tags were found on perianal exam.                           Two sessile polyps were found in the ascending                            colon. The polyps were 4 to 5 mm in size. These                            polyps were removed with a cold snare. Resection                            and retrieval were complete.                           Multiple small-mouthed diverticula were found in                            the left colon and right colon.  The patient is thin and the colon was quite                            tortuous which prolonged the exam.                           The exam was otherwise without abnormality. Complications:            No immediate complications. Estimated blood loss:                            Minimal. Estimated Blood Loss:     Estimated blood loss was minimal. Impression:               - Perianal skin tags found on perianal exam.                           - Two 4 to 5 mm polyps in the ascending colon,                            removed with a cold snare. Resected and retrieved.                           - Diverticulosis in the left colon and in the right                            colon.                           - Tortuous colon.                           - The examination was otherwise normal. Recommendation:           - Patient has a contact number available for                            emergencies. The signs and symptoms of  potential                            delayed complications were discussed with the                            patient. Return to normal activities tomorrow.                            Written discharge instructions were provided to the                            patient.                           - Resume previous diet.                           - Continue present medications.                           -  Await pathology results. Remo Lipps P. Havery Moros, MD 05/14/2020 9:06:38 AM This report has been signed electronically.

## 2020-05-14 NOTE — Progress Notes (Signed)
Called to room to assist during endoscopic procedure.  Patient ID and intended procedure confirmed with present staff. Received instructions for my participation in the procedure from the performing physician.  

## 2020-05-14 NOTE — Patient Instructions (Signed)
Handout on polyps and diverticulosis given to you today  Await pathology results on polyps removed     YOU HAD AN ENDOSCOPIC PROCEDURE TODAY AT [facility]:   Refer to the procedure report that was given to you for any specific questions about what was found during the examination.  If the procedure report does not answer your questions, please call your gastroenterologist to clarify.  If you requested that your care partner not be given the details of your procedure findings, then the procedure report has been included in a sealed envelope for you to review at your convenience later.  YOU SHOULD EXPECT: Some feelings of bloating in the abdomen. Passage of more gas than usual.  Walking can help get rid of the air that was put into your GI tract during the procedure and reduce the bloating. If you had a lower endoscopy (such as a colonoscopy or flexible sigmoidoscopy) you may notice spotting of blood in your stool or on the toilet paper. If you underwent a bowel prep for your procedure, you may not have a normal bowel movement for a few days.  Please Note:  You might notice some irritation and congestion in your nose or some drainage.  This is from the oxygen used during your procedure.  There is no need for concern and it should clear up in a day or so.  SYMPTOMS TO REPORT IMMEDIATELY:   Following lower endoscopy (colonoscopy or flexible sigmoidoscopy):  Excessive amounts of blood in the stool  Significant tenderness or worsening of abdominal pains  Swelling of the abdomen that is new, acute  Fever of 100F or higher    For urgent or emergent issues, a gastroenterologist can be reached at any hour by calling [phone]. Do not use MyChart messaging for urgent concerns.    DIET:  We do recommend a small meal at first, but then you may proceed to your regular diet.  Drink plenty of fluids but you should avoid alcoholic beverages for 24 hours.  ACTIVITY:  You should  plan to take it easy for the rest of today and you should NOT DRIVE or use heavy machinery until tomorrow (because of the sedation medicines used during the test).    FOLLOW UP: Our staff will call the number listed on your records 48-72 hours following your procedure to check on you and address any questions or concerns that you may have regarding the information given to you following your procedure. If we do not reach you, we will leave a message.  We will attempt to reach you two times.  During this call, we will ask if you have developed any symptoms of COVID 19. If you develop any symptoms (ie: fever, flu-like symptoms, shortness of breath, cough etc.) before then, please call [phone].  If you test positive for Covid 19 in the 2 weeks post procedure, please call and report this information to [place].    If any biopsies were taken you will be contacted by phone or by letter within the next 1-3 weeks.  Please call us at [phone] if you have not heard about the biopsies in 3 weeks.    SIGNATURES/CONFIDENTIALITY: You and/or your care partner have signed paperwork which will be entered into your electronic medical record.  These signatures attest to the fact that that the information above on your After Visit Summary has been reviewed and is understood.  Full responsibility of the confidentiality of this discharge information lies with  you and/or your care-partner.

## 2020-05-14 NOTE — Progress Notes (Signed)
Vitals-CW ?

## 2020-05-15 ENCOUNTER — Encounter: Payer: Medicare Other | Admitting: Physical Therapy

## 2020-05-16 ENCOUNTER — Telehealth: Payer: Self-pay

## 2020-05-16 NOTE — Telephone Encounter (Signed)
  Follow up Call-  Call back number 05/14/2020  Post procedure Call Back phone  # [phone]  Permission to leave phone message Yes     Patient questions:  Do you have a fever, pain , or abdominal swelling? No. Pain Score  0 *  Have you tolerated food without any problems? Yes.    Have you been able to return to your normal activities? Yes.    Do you have any questions about your discharge instructions: Diet   No. Medications  No. Follow up visit  No.  Do you have questions or concerns about your Care? No.  Actions: * If pain score is 4 or above: No action needed, pain <4. 1. Have you developed a fever since your procedure? no  2.   Have you had an respiratory symptoms (SOB or cough) since your procedure? no  3.   Have you tested positive for COVID 19 since your procedure no  4.   Have you had any family members/close contacts diagnosed with the COVID 19 since your procedure?  no   If yes to any of these questions please route to Joylene John, RN and Erenest Rasher, RN

## 2020-05-16 NOTE — Telephone Encounter (Signed)
  Follow up Call-  Call back number 05/14/2020  Post procedure Call Back phone  # [phone]  Permission to leave phone message Yes     Left message

## 2020-05-27 ENCOUNTER — Other Ambulatory Visit: Payer: Self-pay

## 2020-05-27 ENCOUNTER — Encounter: Payer: Self-pay | Admitting: Physical Therapy

## 2020-05-27 ENCOUNTER — Ambulatory Visit (INDEPENDENT_AMBULATORY_CARE_PROVIDER_SITE_OTHER): Payer: Medicare Other | Admitting: Physical Therapy

## 2020-05-27 DIAGNOSIS — M62838 Other muscle spasm: Secondary | ICD-10-CM | POA: Diagnosis not present

## 2020-05-27 DIAGNOSIS — M25552 Pain in left hip: Secondary | ICD-10-CM | POA: Diagnosis not present

## 2020-05-27 NOTE — Therapy (Addendum)
[facility] [address] [place], [place], [address] Phone: [phone]   Fax:  [phone]  Physical Therapy Treatment  Patient Details  Name: Nicole Snow MRN: [id number] Date of Birth: 07/12/1951 Referring Provider (PT): Everlean Alstrom   Encounter Date: 05/27/2020   PT End of Session - 05/27/20 1323    Visit Number 4    Number of Visits 12    Date for PT Re-Evaluation 06/12/20    Authorization Type Medicare    PT Start Time 1215    PT Stop Time 1255    PT Time Calculation (min) 40 min    Activity Tolerance Patient tolerated treatment well    Behavior During Therapy [facility] for tasks assessed/performed           Past Medical History:  Diagnosis Date  . Allergy   . C. difficile colitis 08/2016    Past Surgical History:  Procedure Laterality Date  . CHOLECYSTECTOMY    . COLONOSCOPY  02/27/2009   T A polyp  . HYSTEROSCOPY WITH D & C  1992  . LASIK Bilateral   . REPLACEMENT TOTAL HIP W/  RESURFACING IMPLANTS    . TENDON REPAIR Right 1982  . TENDON REPAIR     left hand ring finge at age 16 yr old  . TONSILLECTOMY    . WISDOM TOOTH EXTRACTION      There were no vitals filed for this visit.   Subjective Assessment - 05/27/20 1322    Subjective Pt last seen 2 weeks ago. She was doing much better, but now has increased pain in glute, did a lot of hiking in the past week.    Currently in Pain? Yes    Pain Score 3     Pain Location Hip    Pain Orientation Left    Pain Descriptors / Indicators Aching    Pain Type Acute pain    Pain Onset More than a month ago    Pain Frequency Intermittent                             [facility] Adult PT Treatment/Exercise - 05/27/20 1226      Posture/Postural Control   Posture Comments Standing: hip height appears even,  Supine: L LE longer than R; (did have THA on R)       Exercises   Exercises Knee/Hip      Knee/Hip Exercises: Stretches   Piriformis Stretch 2  reps;30 seconds    Piriformis Stretch Limitations supine mod fig 4 , and hip IR across body       Knee/Hip Exercises: Aerobic   Recumbent Bike L 2 x 8 min;       Knee/Hip Exercises: Standing   Hip Abduction --    Abduction Limitations --    Hip Extension --    Extension Limitations --    Other Standing Knee Exercises lateral step ups x 10 6 in step       Knee/Hip Exercises: Supine   Bridges with Clamshell --    Other Supine Knee/Hip Exercises --      Knee/Hip Exercises: Sidelying   Hip ABduction --      Manual Therapy   Manual Therapy Joint mobilization;Soft tissue mobilization;Passive ROM    Manual therapy comments skilled palpation and monitoring of soft tissue with dry needling    Joint Mobilization inf and post hip mobs, long leg distraction on L.  Soft tissue mobilization DTM and  TPR  to L glute and hip            Trigger Point Dry Needling - 05/27/20 0001    Consent Given? Yes    Education Handout Provided Previously provided    Muscles Treated Back/Hip Gluteus medius;Gluteus minimus;Piriformis;Gluteus maximus    Gluteus Minimus Response Palpable increased muscle length    Gluteus Medius Response Palpable increased muscle length    Gluteus Maximus Response Palpable increased muscle length    Piriformis Response Palpable increased muscle length                PT Education - 05/27/20 1323    Education Details HEP reviewed    Person(s) Educated Patient    Methods Explanation;Demonstration    Comprehension Verbalized understanding;Returned demonstration            PT Short Term Goals - 05/05/20 1627      PT SHORT TERM GOAL #1   Title Pt to be independent with initial HEP    Time 2    Period Weeks    Status New    Target Date 05/15/20      PT SHORT TERM GOAL #2   Title Pt to report decreased pain in L hip to 0-4/10    Time 2    Period Weeks    Status New    Target Date 05/15/20             PT Long Term Goals - 05/05/20 1629      PT  LONG TERM GOAL #1   Title Pt to be independent with final HEP    Time 2    Period Weeks    Status New    Target Date 06/12/20      PT LONG TERM GOAL #2   Title Pt to report decreased pain in L hip to be 0-1/10 with activity and sitting for at least 30 min    Time 6    Period Weeks    Status New    Target Date 06/12/20      PT LONG TERM GOAL #3   Title Pt to demo improved strength of Bil hips to at least 4+/5 to improve stability and pain    Time 6    Period Weeks    Status New    Target Date 06/12/20                 Plan - 05/27/20 1324    Clinical Impression Statement Pt with continued muscle tightness and soreness in L glue. Focus on manual today to improve. Reviewed best exercises for stretches and strengthening. Pt does have noted leg length difference in supine, but mostly unnoticable in standing. Did dicuss option to try heel lift on R if pain does not improve in coming months.    Examination-Activity Limitations Transfers;Bend;Sit;Sleep;Squat;Stand;Lift;Stairs    Examination-Participation Restrictions Yard Work;Cleaning;Community Activity    Stability/Clinical Decision Making Stable/Uncomplicated    Rehab Potential Good    PT Frequency 2x / week    PT Duration 6 weeks    PT Treatment/Interventions ADLs/Self Care Home Management;Cryotherapy;Electrical Stimulation;DME Instruction;Ultrasound;Traction;Moist Heat;Iontophoresis 56m/ml Dexamethasone;Gait training;Stair training;Functional mobility training;Therapeutic activities;Therapeutic exercise;Balance training;Patient/family education;Neuromuscular re-education;Manual techniques;Taping;Dry needling;Passive range of motion;Spinal Manipulations;Joint Manipulations    PT Home Exercise Plan [id number]   Consulted and Agree with Plan of Care Patient           Patient will benefit from skilled therapeutic intervention in order to improve  the following deficits and impairments:  Pain, Increased muscle spasms, Decreased  mobility, Decreased activity tolerance, Decreased range of motion, Decreased strength  Visit Diagnosis: Pain in left hip  Other muscle spasm     Problem List Patient Active Problem List   Diagnosis Date Noted  . Tendinopathy of left gluteus medius 04/30/2020  . Impacted cerumen of left ear 02/02/2017  . Sensation of fullness in left ear 02/02/2017  . Sensorineural hearing loss (SNHL) of left ear with unrestricted hearing of right ear 02/02/2017    Lyndee Hensen, PT, DPT 1:25 PM  05/27/20    [facility] [place], [place], [address] Phone: [phone]   Fax:  [phone]  Name: Nicole Snow MRN: [id number] Date of Birth: 09-07-1951   PHYSICAL THERAPY DISCHARGE SUMMARY  Visits from Start of Care: 4  Plan: Patient agrees to discharge.  Patient goals were partially met. Patient is being discharged due to not returning since the last visit.  ?????     Lyndee Hensen, PT, DPT 9:52 AM  02/18/21

## 2020-05-29 ENCOUNTER — Other Ambulatory Visit: Payer: Self-pay

## 2020-05-29 ENCOUNTER — Encounter: Payer: Self-pay | Admitting: Podiatry

## 2020-05-29 ENCOUNTER — Encounter: Payer: Medicare Other | Admitting: Physical Therapy

## 2020-05-29 ENCOUNTER — Ambulatory Visit (INDEPENDENT_AMBULATORY_CARE_PROVIDER_SITE_OTHER): Payer: Medicare Other | Admitting: Podiatry

## 2020-05-29 DIAGNOSIS — B351 Tinea unguium: Secondary | ICD-10-CM

## 2020-05-29 MED ORDER — TERBINAFINE HCL 250 MG PO TABS: ORAL_TABLET | ORAL | 0 refills | Status: DC

## 2020-05-29 NOTE — Progress Notes (Signed)
Subjective:   Patient ID: Nicole Snow, female   DOB: 69 y.o.   MRN: [id number]   HPI Patient states I am improved but still have some discoloration especially my left big toenail   ROS      Objective:  Physical Exam  Neurovascular status intact with patient found to have moderate discoloration left hallux distal two thirds slight discoloration on the right      Assessment:  Low grade mycotic nail infection     Plan:  Reviewed condition and at this point I recommended the continuation of conservative care we will get a start her just on a Lamisil pulse therapy 7 pills a month for 4 months and then we will consider several more laser treatments in the next several months.  Patient will let us know how she is progressing

## 2020-05-30 ENCOUNTER — Ambulatory Visit: Payer: Medicare Other | Admitting: Podiatry

## 2020-06-07 ENCOUNTER — Ambulatory Visit: Payer: Medicare Other | Admitting: Podiatry

## 2020-07-11 ENCOUNTER — Other Ambulatory Visit: Payer: Self-pay

## 2020-07-11 ENCOUNTER — Ambulatory Visit (INDEPENDENT_AMBULATORY_CARE_PROVIDER_SITE_OTHER): Payer: Medicare Other | Admitting: Sports Medicine

## 2020-07-11 VITALS — BP 122/80 | Ht 67.5 in | Wt 114.0 lb

## 2020-07-11 DIAGNOSIS — M67952 Unspecified disorder of synovium and tendon, left thigh: Secondary | ICD-10-CM

## 2020-07-11 NOTE — Progress Notes (Signed)
Office Visit Note   Patient: Nicole Snow           Date of Birth: [birth year]           MRN: [id number] Visit Date: 07/11/2020 Requested by: Isaac Bliss, Rayford Halsted, MD [place],  [place] [address] PCP: Isaac Bliss, Rayford Halsted, MD  Subjective: CC: Ongoing L Leg/Hip Pain  HPI:  69yo F presenting to clinic with concerns of nearly 6 mo of ongoing left posterior/lateral hip pain, which has not improved with physical therapy or activity modifications. Patient is an avid Optician, dispensing, and states her pain has become so severe she is barely able to participate in her usual hobbies. Pain is primarily located throughout the posterior aspect of her left hip, but now it also travels down her anterior thigh and occasionally into her groin. She has had a hip replacement on the right, and states that her current pain feels very different than her hip arthritis pain did prior to her replacement.  Pain is severe with cutting to the left while on the pickleball court, but also significantly worsened with hiking on uneven terrain. Pain will also wake her up at night when sleeping in certain positions. Her physical therapist has done dry needling, which she states is very helpful- but the relief only lasts for a few days before her pain returns. Her physical therapist also recommended trying a heel lift due to a leg length discrepancy she noticed, but the patient states this was 'awful' and she was only able to tolerate it for about a week.               ROS:   All other systems were reviewed and are negative.  Objective: Vital Signs: BP 122/80   Ht 5' 7.5" (1.715 m)   Wt 114 lb (51.7 kg)   LMP  (LMP Unknown)   BMI 17.59 kg/m   Physical Exam:  General:  Alert and oriented, in no acute distress. Cardiac: Appears well perfused.  Pulm:  Breathing unlabored. Psy:  Normal mood, congruent affect. Skin:  No rashes.   Left hip:  Normal gait.  No varus or  valgus deformity of the knee.  Pelvis with L ASIS visibly higher than right.  Hip with full ROM in Flexion, Extension, and internal rotation, however significantly reduced in external rotation.  Strength: 5 out of 5 strength with hip flexion and extension, as well as knee flexion and extension.  5 out of 5 strength with hip abduction and adduction.   Palpation:  Moderate tenderness to palpation over the greater trochanteric area, as well as within the piriformis, and gluteal musculature (primarily in the area of glute med).  No tenderness over bilateral SI joints.  Spring's test negative. No anterior hip flexor tender points. Does have some mild additional tenderness along the quad musculature.  Supine exam: No pain with logroll.  FADIR does produce groin pain. FABER produces no posterior or groin pain.    Assessment & Plan: 69yo F presenting to clinic with concerns of ongoing left posterolateral hip pain, now involving her anterior aspect of her thigh and into her groin.  -Given persistence of her pain despite conservative therapy, will order advanced imaging to further evaluate a cause for her continued discomfort.  -Patient given instructions to schedule this MRI, and we will call her when we have the results.  -Continue HEP as given by physical therapy while awaiting MRI.  -Follow up pending MRI.  -  Patient has no further questions or concerns today.   Patient seen and evaluated with the sports medicine fellow.  I agree with the above plan of care.  Patient has had symptoms for several months despite conservative treatment including formal physical therapy.  At this point I would like to get an MRI specifically to rule out gluteus medius or gluteus minimus tendon tear.  Phone follow-up with those results when available and we will delineate further treatment based on those findings.

## 2020-07-11 NOTE — Patient Instructions (Addendum)
We've ordered an MRI to be done at [facility]. Call them to schedule this study at [phone].  -We will contact you once we have your MRI results to go over the next steps in care.  -While awaiting your MRI, you can continue the Home exercises and stretches as prescribed by your physical therapist.

## 2020-07-16 ENCOUNTER — Encounter: Payer: Medicare Other | Admitting: Physical Therapy

## 2020-07-17 ENCOUNTER — Encounter: Payer: Self-pay | Admitting: Internal Medicine

## 2020-07-18 ENCOUNTER — Encounter: Payer: Self-pay | Admitting: Podiatry

## 2020-07-18 ENCOUNTER — Ambulatory Visit: Payer: Medicare Other

## 2020-07-18 ENCOUNTER — Ambulatory Visit (INDEPENDENT_AMBULATORY_CARE_PROVIDER_SITE_OTHER): Payer: Medicare Other | Admitting: Podiatry

## 2020-07-18 ENCOUNTER — Other Ambulatory Visit: Payer: Self-pay

## 2020-07-18 ENCOUNTER — Ambulatory Visit (INDEPENDENT_AMBULATORY_CARE_PROVIDER_SITE_OTHER): Payer: Medicare Other

## 2020-07-18 DIAGNOSIS — R58 Hemorrhage, not elsewhere classified: Secondary | ICD-10-CM

## 2020-07-18 DIAGNOSIS — M7752 Other enthesopathy of left foot: Secondary | ICD-10-CM

## 2020-07-18 DIAGNOSIS — M79672 Pain in left foot: Secondary | ICD-10-CM

## 2020-07-19 ENCOUNTER — Encounter: Payer: Self-pay | Admitting: Podiatry

## 2020-07-19 NOTE — Progress Notes (Signed)
Subjective:  Patient ID: Nicole Snow, female    DOB: 03-13-1951,  MRN: [id number]  Chief Complaint  Patient presents with  . Toe Pain    left 3rd pain- pt mentioned she has went for a long walk- mentioned since then she has day pain and discoloartion on toe- further evaluation     69 y.o. female presents with the above complaint. Patient presents with ecchymosis to the left plantar third digit. Patient states that she went for a long walk and then all of a sudden she had this pain afterwards especially when applying pressure to the toes. Patient states that there is a dark discoloration underneath the third toe. She has a little bit of a bump on the bottom of the toe. She denies any other acute complaints. Her pain is pretty well controlled. She has not seen anyone else prior to seeing me.   Review of Systems: Negative except as noted in the HPI. Denies N/V/F/Ch.  Past Medical History:  Diagnosis Date  . Allergy   . C. difficile colitis 08/2016    Current Outpatient Medications:  .  CALCIUM PO, Take 1 tablet by mouth daily., Disp: , Rfl:  .  cetirizine (ZYRTEC) 10 MG tablet, Take by mouth., Disp: , Rfl:  .  fluticasone (FLONASE) 50 MCG/ACT nasal spray, Place into the nose., Disp: , Rfl:  .  Ginkgo 60 MG TABS, Take by mouth., Disp: , Rfl:  .  Multiple Vitamins-Minerals (EMERGEN-C IMMUNE PO), Take by mouth., Disp: , Rfl:  .  Probiotic Product (PROBIOTIC PO), Take 1 tablet by mouth daily., Disp: , Rfl:  .  terbinafine (LAMISIL) 250 MG tablet, Please take one a day x 7days, repeat every 4 weeks x 4 months, Disp: 28 tablet, Rfl: 0 .  Turmeric (QC TUMERIC COMPLEX PO), Take by mouth., Disp: , Rfl:   Social History   Tobacco Use  Smoking Status Never Smoker  Smokeless Tobacco Never Used    Allergies  Allergen Reactions  . Ciprofloxacin Hives  . Clindamycin/Lincomycin Hives  . Flagyl [Metronidazole] Hives  . Hydrocodone Hives  . Penicillins    Objective:  There were  no vitals filed for this visit. There is no height or weight on file to calculate BMI. Constitutional Well developed. Well nourished.  Vascular Dorsalis pedis pulses palpable bilaterally. Posterior tibial pulses palpable bilaterally. Capillary refill normal to all digits.  No cyanosis or clubbing noted. Pedal hair growth normal.  Neurologic Normal speech. Oriented to person, place, and time. Epicritic sensation to light touch grossly present bilaterally.  Dermatologic Nails well groomed and normal in appearance. No open wounds. No skin lesions.  Orthopedic:  Mild pain on palpation to the left plantar surface of the third digit. Mild palpable mass noted consistent with fibroma. There is ecchymosis present. Pain with resisted plantarflexion of the digit. No pain at the metatarsophalangeal joint of the third.   Radiographs: 3 views of skeletally mature left foot adult: No osseous abnormalities noted. No fractures noted. Assessment:   1. Tendinitis of left foot   2. Pain in left foot   3. Ecchymosis    Plan:  Patient was evaluated and treated and all questions answered.  Left third digit flexor tendinitis versus small plantar fibroma -I explained to the patient the etiology of tendinitis and various treatment options were discussed. Given that patient may have had a hyperflexion injury leading to partial tearing of the flexor tendon leading to underlying ecchymosis especially without any radiographic evidence of underlying fracture,  I believe patient will benefit from surgical shoe to prevent forefoot flexion and therefore allow the injury to heal itself. Patient agrees with the plan. -Surgical shoe was dispensed  No follow-ups on file.

## 2020-07-22 ENCOUNTER — Telehealth: Payer: Self-pay | Admitting: Sports Medicine

## 2020-07-22 ENCOUNTER — Telehealth: Payer: Self-pay | Admitting: Physician Assistant

## 2020-07-22 NOTE — Telephone Encounter (Signed)
  I spoke with Nicole Snow on the phone this morning after reviewing MRI findings of her left hip.  Dominant finding is severe left hip joint osteoarthritis long with chronic tearing and maceration of the labrum.  No evidence of gluteus medius or gluteus minimus tendinopathy or tearing.  Based on these findings I recommended consultation with orthopedics to discuss treatment options.  I will provide her with the contact information for Dr. Ninfa Linden, Dr. Mayer Camel, and Dr. Wynelle Link.  She will call me back with the name of the surgeon that she would like to be referred to.

## 2020-07-23 ENCOUNTER — Encounter: Payer: Self-pay | Admitting: Sports Medicine

## 2020-07-23 NOTE — Telephone Encounter (Signed)
Made in error

## 2020-07-30 ENCOUNTER — Other Ambulatory Visit: Payer: Medicare Other

## 2020-08-02 ENCOUNTER — Ambulatory Visit: Payer: Medicare Other | Attending: Internal Medicine

## 2020-08-02 DIAGNOSIS — Z23 Encounter for immunization: Secondary | ICD-10-CM

## 2020-08-02 NOTE — Progress Notes (Signed)
   Covid-19 Vaccination Clinic  Name:  Nicole Snow    MRN: [id number] DOB: [birth year]  08/02/2020  [name] was observed post Covid-19 immunization for 15 minutes without incident. She was provided with Vaccine Information Sheet and instruction to access the V-Safe system.   [name] was instructed to call 911 with any severe reactions post vaccine: Marland Kitchen Difficulty breathing  . Swelling of face and throat  . A fast heartbeat  . A bad rash all over body  . Dizziness and weakness

## 2020-08-05 ENCOUNTER — Ambulatory Visit: Payer: Self-pay

## 2020-08-05 ENCOUNTER — Ambulatory Visit (INDEPENDENT_AMBULATORY_CARE_PROVIDER_SITE_OTHER): Payer: Medicare Other | Admitting: Orthopaedic Surgery

## 2020-08-05 VITALS — Ht 67.5 in | Wt 114.0 lb

## 2020-08-05 DIAGNOSIS — M1612 Unilateral primary osteoarthritis, left hip: Secondary | ICD-10-CM

## 2020-08-05 DIAGNOSIS — M25559 Pain in unspecified hip: Secondary | ICD-10-CM | POA: Diagnosis not present

## 2020-08-05 NOTE — Progress Notes (Signed)
Office Visit Note   Patient: Nicole Snow           Date of Birth: 03/21/1951           MRN: [id number] Visit Date: 08/05/2020              Requested by: Isaac Bliss, Rayford Halsted, MD [place],  [place] [id number] PCP: Isaac Bliss, Rayford Halsted, MD   Assessment & Plan: Visit Diagnoses:  1. Hip pain   2. Unilateral primary osteoarthritis, left hip     Plan: Given the severity of her arthritis on x-ray, MRI and clinical exam as well as her signs and symptoms, we are recommending a left total hip arthroplasty direct under approach.  She does wish to proceed with this as well in the near future.  Having had this before she is fully aware of the risk and benefits of surgery.  We talked about her interoperative and postoperative course and what to expect.  All questions and concerns were answered and addressed.  We will work on getting her scheduled in the near future.  Follow-Up Instructions: Return for 2 weeks post-op.   Orders:  Orders Placed This Encounter  Procedures   XR HIP UNILAT W OR W/O PELVIS 1V LEFT   No orders of the defined types were placed in this encounter.     Procedures: No procedures performed   Clinical Data: No additional findings.   Subjective: Chief Complaint  Patient presents with   Left Hip - Pain  The patient is a very pleasant and active 69 year old who has been having worsening hip pain for less than a year now.  A MRI was obtained recently and the MRI did show severe left hip osteoarthritis with particular osteophytes.  There is also areas of marrow edema of the anterior acetabulum and femoral head.  There is degenerative labral tear.  She does have a history of a right total hip arthroplasty that was done remotely and that is done well.  That was done through a direct anterior approach.  She is a very healthy individual and very active.  She plays a lot of pickle ball.  She is not a diabetic and not a smoker.  At  this point her left hip pain is definitely affecting her mobility, her quality of life and her actives daily living to the point she does not wish to proceed with a total hip arthroplasty on the left side given the success of her right hip replacement was done in [facility].  HPI  Review of Systems She currently denies any headache, chest pain, shortness of breath, fever, chills, nausea, vomiting  Objective: Vital Signs: Ht 5' 7.5" (1.715 m)    Wt 114 lb (51.7 kg)    LMP  (LMP Unknown)    BMI 17.59 kg/m   Physical Exam She is alert and orient x3 and in no acute distress Ortho Exam Examination of her left hip shows pain with internal and external rotation and some slight stiffness.  When I have her lay in a supine position her left side is barely slightly longer than the right side. Specialty Comments:  No specialty comments available.  Imaging: XR HIP UNILAT W OR W/O PELVIS 1V LEFT  Result Date: 08/05/2020 An AP pelvis lateral left hip shows arthritic findings of the left hip with caretaker osteophytes and joint space narrowing.  There is also some flattening of the lateral femoral head.    PMFS History:  Patient Active Problem List   Diagnosis Date Noted   Unilateral primary osteoarthritis, left hip 08/05/2020   Tendinopathy of left gluteus medius 04/30/2020   Impacted cerumen of left ear 02/02/2017   Sensation of fullness in left ear 02/02/2017   Sensorineural hearing loss (SNHL) of left ear with unrestricted hearing of right ear 02/02/2017   Past Medical History:  Diagnosis Date   Allergy    C. difficile colitis 08/2016    Family History  Problem Relation Age of Onset   Dementia Mother    CVA Father    COPD Father    Colon cancer Neg Hx    Rectal cancer Neg Hx    Stomach cancer Neg Hx     Past Surgical History:  Procedure Laterality Date   CHOLECYSTECTOMY     COLONOSCOPY  02/27/2009   T A polyp   HYSTEROSCOPY WITH D & C  1992    LASIK Bilateral    REPLACEMENT TOTAL HIP W/  RESURFACING IMPLANTS     TENDON REPAIR Right 1982   TENDON REPAIR     left hand ring finge at age 64 yr old   TONSILLECTOMY     WISDOM TOOTH EXTRACTION     Social History   Occupational History   Not on file  Tobacco Use   Smoking status: Never Smoker   Smokeless tobacco: Never Used  Vaping Use   Vaping Use: Never used  Substance and Sexual Activity   Alcohol use: Yes    Alcohol/week: 7.0 standard drinks    Types: 7 Glasses of wine per week    Comment: beer/wine   Drug use: Never   Sexual activity: Not on file

## 2020-08-06 ENCOUNTER — Encounter: Payer: Self-pay | Admitting: Orthopaedic Surgery

## 2020-08-14 ENCOUNTER — Other Ambulatory Visit: Payer: Self-pay

## 2020-08-14 ENCOUNTER — Ambulatory Visit (INDEPENDENT_AMBULATORY_CARE_PROVIDER_SITE_OTHER): Payer: Medicare Other | Admitting: Podiatry

## 2020-08-14 ENCOUNTER — Encounter: Payer: Self-pay | Admitting: Podiatry

## 2020-08-14 DIAGNOSIS — M79672 Pain in left foot: Secondary | ICD-10-CM

## 2020-08-14 DIAGNOSIS — R58 Hemorrhage, not elsewhere classified: Secondary | ICD-10-CM

## 2020-08-14 DIAGNOSIS — M7752 Other enthesopathy of left foot: Secondary | ICD-10-CM | POA: Diagnosis not present

## 2020-08-14 NOTE — Progress Notes (Signed)
Subjective:  Patient ID: Nicole Snow, female    DOB: 04-18-1951,  MRN: [id number]  Chief Complaint  Patient presents with  . Foot Pain    PT stated that she is doing great and has no concerens and denies pain at this time     69 y.o. female presents with the above complaint.  Patient presents with a follow-up of left plantar third digit fibroma versus tendinitis.  Patient states she is doing a lot better she does not have any pain.  She denies any other acute complaints.   Review of Systems: Negative except as noted in the HPI. Denies N/V/F/Ch.  Past Medical History:  Diagnosis Date  . Allergy   . C. difficile colitis 08/2016    Current Outpatient Medications:  .  aspirin 81 MG chewable tablet, Please take 1 tablet each day with food for the first 30 days after surgery, Disp: , Rfl:  .  CALCIUM PO, Take 1 tablet by mouth daily., Disp: , Rfl:  .  cetirizine (ZYRTEC) 10 MG tablet, Take by mouth., Disp: , Rfl:  .  cyanocobalamin 100 MCG tablet, Take by mouth., Disp: , Rfl:  .  fluticasone (FLONASE) 50 MCG/ACT nasal spray, Place into the nose., Disp: , Rfl:  .  Ginkgo 60 MG TABS, Take by mouth., Disp: , Rfl:  .  Magnesium 100 MG CAPS, Take by mouth., Disp: , Rfl:  .  meloxicam (MOBIC) 7.5 MG tablet, Take 7.5 mg by mouth daily., Disp: , Rfl:  .  Multiple Vitamins-Minerals (EMERGEN-C IMMUNE PO), Take by mouth., Disp: , Rfl:  .  Probiotic Product (PROBIOTIC PO), Take 1 tablet by mouth daily., Disp: , Rfl:  .  terbinafine (LAMISIL) 250 MG tablet, Please take one a day x 7days, repeat every 4 weeks x 4 months, Disp: 28 tablet, Rfl: 0 .  Turmeric (QC TUMERIC COMPLEX PO), Take by mouth., Disp: , Rfl:   Social History   Tobacco Use  Smoking Status Never Smoker  Smokeless Tobacco Never Used    Allergies  Allergen Reactions  . Ciprofloxacin Hives  . Clindamycin/Lincomycin Hives  . Flagyl [Metronidazole] Hives  . Hydrocodone Hives  . Penicillins    Objective:  There  were no vitals filed for this visit. There is no height or weight on file to calculate BMI. Constitutional Well developed. Well nourished.  Vascular Dorsalis pedis pulses palpable bilaterally. Posterior tibial pulses palpable bilaterally. Capillary refill normal to all digits.  No cyanosis or clubbing noted. Pedal hair growth normal.  Neurologic Normal speech. Oriented to person, place, and time. Epicritic sensation to light touch grossly present bilaterally.  Dermatologic Nails well groomed and normal in appearance. No open wounds. No skin lesions.  Orthopedic:  No pain on palpation to the left plantar surface of the third digit. Mild palpable mass noted consistent with fibroma.  Ecchymosis resolved.  No pain with resisted plantarflexion of the digit. No pain at the metatarsophalangeal joint of the third.   Radiographs: 3 views of skeletally mature left foot adult: No osseous abnormalities noted. No fractures noted. Assessment:   1. Tendinitis of left foot   2. Pain in left foot   3. Ecchymosis    Plan:  Patient was evaluated and treated and all questions answered.  Left third digit flexor tendinitis versus small plantar fibroma -Clinically healed.  I encouraged patient to transition to regular shoes and slowly return back to regular activities including walking.  Patient states understanding will continue to do so.   -  If any foot and ankle issues arise in the future I instructed her to come back and see me.  No follow-ups on file.

## 2020-08-16 ENCOUNTER — Other Ambulatory Visit: Payer: Self-pay | Admitting: Podiatry

## 2020-08-16 DIAGNOSIS — M7752 Other enthesopathy of left foot: Secondary | ICD-10-CM

## 2020-08-19 ENCOUNTER — Other Ambulatory Visit: Payer: Self-pay

## 2020-08-19 ENCOUNTER — Encounter (INDEPENDENT_AMBULATORY_CARE_PROVIDER_SITE_OTHER): Payer: Self-pay | Admitting: Otolaryngology

## 2020-08-19 ENCOUNTER — Ambulatory Visit (INDEPENDENT_AMBULATORY_CARE_PROVIDER_SITE_OTHER): Payer: Medicare Other | Admitting: Otolaryngology

## 2020-08-19 VITALS — Temp 97.5°F

## 2020-08-19 DIAGNOSIS — H6122 Impacted cerumen, left ear: Secondary | ICD-10-CM | POA: Diagnosis not present

## 2020-08-19 DIAGNOSIS — H61302 Acquired stenosis of left external ear canal, unspecified: Secondary | ICD-10-CM | POA: Diagnosis not present

## 2020-08-19 NOTE — Progress Notes (Signed)
HPI: Nicole Snow is a 69 y.o. female who returns today for evaluation of wax buildup in her left ear.  She feels like the left ear is always obstructed sometimes with the water.  This has been getting worse over the past 2 years.  Prior to 2 years ago she had no problems with the left ear.  Past Medical History:  Diagnosis Date  . Allergy   . C. difficile colitis 08/2016   Past Surgical History:  Procedure Laterality Date  . CHOLECYSTECTOMY    . COLONOSCOPY  02/27/2009   T A polyp  . HYSTEROSCOPY WITH D & C  1992  . LASIK Bilateral   . REPLACEMENT TOTAL HIP W/  RESURFACING IMPLANTS    . TENDON REPAIR Right 1982  . TENDON REPAIR     left hand ring finge at age 89 yr old  . TONSILLECTOMY    . WISDOM TOOTH EXTRACTION     Social History   Socioeconomic History  . Marital status: Married    Spouse name: Not on file  . Number of children: Not on file  . Years of education: Not on file  . Highest education level: Not on file  Occupational History  . Not on file  Tobacco Use  . Smoking status: Never Smoker  . Smokeless tobacco: Never Used  Vaping Use  . Vaping Use: Never used  Substance and Sexual Activity  . Alcohol use: Yes    Alcohol/week: 7.0 standard drinks    Types: 7 Glasses of wine per week    Comment: beer/wine  . Drug use: Never  . Sexual activity: Not on file  Other Topics Concern  . Not on file  Social History Narrative  . Not on file   Social Determinants of Health   Financial Resource Strain:   . Difficulty of Paying Living Expenses: Not on file  Food Insecurity:   . Worried About Charity fundraiser in the Last Year: Not on file  . Ran Out of Food in the Last Year: Not on file  Transportation Needs:   . Lack of Transportation (Medical): Not on file  . Lack of Transportation (Non-Medical): Not on file  Physical Activity:   . Days of Exercise per Week: Not on file  . Minutes of Exercise per Session: Not on file  Stress:   . Feeling of  Stress : Not on file  Social Connections:   . Frequency of Communication with Friends and Family: Not on file  . Frequency of Social Gatherings with Friends and Family: Not on file  . Attends Religious Services: Not on file  . Active Member of Clubs or Organizations: Not on file  . Attends Archivist Meetings: Not on file  . Marital Status: Not on file   Family History  Problem Relation Age of Onset  . Dementia Mother   . CVA Father   . COPD Father   . Colon cancer Neg Hx   . Rectal cancer Neg Hx   . Stomach cancer Neg Hx    Allergies  Allergen Reactions  . Ciprofloxacin Hives  . Clindamycin/Lincomycin Hives  . Flagyl [Metronidazole] Hives  . Hydrocodone Hives  . Penicillins    Prior to Admission medications   Medication Sig Start Date End Date Taking? Authorizing Provider  aspirin 81 MG chewable tablet Please take 1 tablet each day with food for the first 30 days after surgery 07/25/20  Yes [provider]  CALCIUM PO Take 1  tablet by mouth daily.   Yes [provider]  cetirizine (ZYRTEC) 10 MG tablet Take by mouth.   Yes [provider]  cyanocobalamin 100 MCG tablet Take by mouth.   Yes [provider]  fluticasone (FLONASE) 50 MCG/ACT nasal spray Place into the nose.   Yes [provider]  Ginkgo 60 MG TABS Take by mouth.   Yes [provider]  Magnesium 100 MG CAPS Take by mouth.   Yes [provider]  meloxicam (MOBIC) 7.5 MG tablet Take 7.5 mg by mouth daily. 07/25/20  Yes [provider]  Multiple Vitamins-Minerals (EMERGEN-C IMMUNE PO) Take by mouth.   Yes [provider]  Probiotic Product (PROBIOTIC PO) Take 1 tablet by mouth daily.   Yes [provider]  terbinafine (LAMISIL) 250 MG tablet Please take one a day x 7days, repeat every 4 weeks x 4 months 05/29/20  Yes Regal, Tamala Fothergill, DPM  Turmeric (QC TUMERIC COMPLEX PO) Take by mouth.   Yes [provider]      Positive ROS: Otherwise negative  All other systems have been reviewed and were otherwise negative with the exception of those mentioned in the HPI and as above.  Physical Exam: Constitutional: Alert, well-appearing, no acute distress Ears: External ears without lesions or tenderness.  Right ear canal with minimal cerumen that was removed and is easily visualized.  Right TM is clear and normal.  On the left side she has just a small slit for an opening to the left ear canal.  I am barely able to get a #5 suction through the slit with a small curette to clean the wax from the left ear canal.  It was difficult to get adequate visualization of the TM.  There is no obvious mass or growth around the external ear canal. Nasal: External nose without lesions.. Clear nasal passages Oral: Lips and gums without lesions. Tongue and palate mucosa without lesions. Posterior oropharynx clear. Neck: No palpable adenopathy or masses Respiratory: Breathing comfortably  Skin: No facial/neck lesions or rash noted.  Cerumen impaction removal  Date/Time: 08/19/2020 5:04 PM Performed by: Rozetta Nunnery, MD Authorized by: Rozetta Nunnery, MD   Consent:    Consent obtained:  Verbal   Consent given by:  Patient   Risks discussed:  Pain and bleeding Procedure details:    Location:  L ear   Procedure type: curette and suction   Post-procedure details:    Inspection:  TM intact   Hearing quality:  Improved   Patient tolerance of procedure:  Tolerated well, no immediate complications Comments:     Left ear canal is a small slit and very stenotic.    Assessment: Left ear canal stenosis with obstruction.  Plan: This was cleaned in the office.  Symptoms seem to have been getting worse over the past year. In the long run she would probably benefit by enlarging the left ear canal surgically but would recommend obtaining a CT scan of the left ear canal and left temporal bone prior to any  surgical intervention. She is scheduled for surgery in November. We will go ahead and schedule her for a CT scan of the temporal bone and she will call us after the CT scan to review this.   Radene Journey, MD

## 2020-08-20 ENCOUNTER — Other Ambulatory Visit (INDEPENDENT_AMBULATORY_CARE_PROVIDER_SITE_OTHER): Payer: Self-pay

## 2020-08-20 DIAGNOSIS — H61302 Acquired stenosis of left external ear canal, unspecified: Secondary | ICD-10-CM

## 2020-08-22 ENCOUNTER — Other Ambulatory Visit (INDEPENDENT_AMBULATORY_CARE_PROVIDER_SITE_OTHER): Payer: Self-pay

## 2020-08-23 ENCOUNTER — Ambulatory Visit
Admission: RE | Admit: 2020-08-23 | Discharge: 2020-08-23 | Disposition: A | Discharge status: Home / Self Care | Payer: Medicare Other | Source: Ambulatory Visit | Attending: Otolaryngology | Admitting: Otolaryngology

## 2020-08-23 DIAGNOSIS — H61302 Acquired stenosis of left external ear canal, unspecified: Secondary | ICD-10-CM

## 2020-08-27 ENCOUNTER — Other Ambulatory Visit: Payer: Self-pay

## 2020-08-27 ENCOUNTER — Encounter: Payer: Self-pay | Admitting: Internal Medicine

## 2020-08-27 ENCOUNTER — Ambulatory Visit (INDEPENDENT_AMBULATORY_CARE_PROVIDER_SITE_OTHER): Payer: Medicare Other | Admitting: Internal Medicine

## 2020-08-27 VITALS — BP 130/80 | HR 59 | Temp 98.2°F | Wt 113.2 lb

## 2020-08-27 DIAGNOSIS — Z01818 Encounter for other preprocedural examination: Secondary | ICD-10-CM | POA: Diagnosis not present

## 2020-08-27 DIAGNOSIS — S60450A Superficial foreign body of right index finger, initial encounter: Secondary | ICD-10-CM

## 2020-08-27 NOTE — Patient Instructions (Addendum)
-  Nice seeing you today!!  -Lab work today; will notify you once results are available.  -After labs have been reviewed, we will clear you for surgery.  -Please schedule follow up for physical in the next 6 months and come in fasting that day.

## 2020-08-27 NOTE — Progress Notes (Signed)
Established Patient Office Visit     This visit occurred during the SARS-CoV-2 public health emergency.  Safety protocols were in place, including screening questions prior to the visit, additional usage of staff PPE, and extensive cleaning of exam room while observing appropriate contact time as indicated for disinfecting solutions.    CC/Reason for Visit: Preoperative clearance, foreign body in her right index finger  HPI: [place] Brunilda Eble is a 69 y.o. female who is coming in today for the above mentioned reasons.  She has no past medical history of significance.  She has had progressive and ongoing pain of her left hip since early summer.  After imaging, it has been decided that she would benefit from a left hip arthroplasty.  Her orthopedist is sending her here for surgical clearance.  She has no past medical history of significance.  She has not had any chest pain or shortness of breath.  She does not have diabetes or hypertension.  They are requesting labs.  While gardening last week, she had some sort of foreign body insert itself on the right index finger right at the lateral PIP area.  There is some mild surrounding erythema but no purulence.   Past Medical/Surgical History: Past Medical History:  Diagnosis Date  . Allergy   . C. difficile colitis 08/2016    Past Surgical History:  Procedure Laterality Date  . CHOLECYSTECTOMY    . COLONOSCOPY  02/27/2009   T A polyp  . HYSTEROSCOPY WITH D & C  1992  . LASIK Bilateral   . REPLACEMENT TOTAL HIP W/  RESURFACING IMPLANTS    . TENDON REPAIR Right 1982  . TENDON REPAIR     left hand ring finge at age 63 yr old  . TONSILLECTOMY    . WISDOM TOOTH EXTRACTION      Social History:  reports that she has never smoked. She has never used smokeless tobacco. She reports current alcohol use of about 7.0 standard drinks of alcohol per week. She reports that she does not use drugs.  Allergies: Allergies  Allergen  Reactions  . Ciprofloxacin Hives  . Clindamycin/Lincomycin Hives  . Flagyl [Metronidazole] Hives  . Hydrocodone Hives  . Penicillins     Family History:  Family History  Problem Relation Age of Onset  . Dementia Mother   . CVA Father   . COPD Father   . Colon cancer Neg Hx   . Rectal cancer Neg Hx   . Stomach cancer Neg Hx      Current Outpatient Medications:  .  aspirin 81 MG chewable tablet, Please take 1 tablet each day with food for the first 30 days after surgery, Disp: , Rfl:  .  CALCIUM PO, Take 1 tablet by mouth daily., Disp: , Rfl:  .  cetirizine (ZYRTEC) 10 MG tablet, Take by mouth., Disp: , Rfl:  .  cyanocobalamin 100 MCG tablet, Take by mouth., Disp: , Rfl:  .  fluticasone (FLONASE) 50 MCG/ACT nasal spray, Place into the nose., Disp: , Rfl:  .  Ginkgo 60 MG TABS, Take by mouth., Disp: , Rfl:  .  Magnesium 100 MG CAPS, Take by mouth., Disp: , Rfl:  .  meloxicam (MOBIC) 7.5 MG tablet, Take 7.5 mg by mouth daily., Disp: , Rfl:  .  Multiple Vitamins-Minerals (EMERGEN-C IMMUNE PO), Take by mouth., Disp: , Rfl:  .  Probiotic Product (PROBIOTIC PO), Take 1 tablet by mouth daily., Disp: , Rfl:  .  terbinafine (LAMISIL)  250 MG tablet, Please take one a day x 7days, repeat every 4 weeks x 4 months, Disp: 28 tablet, Rfl: 0 .  Turmeric (QC TUMERIC COMPLEX PO), Take by mouth., Disp: , Rfl:   Review of Systems:  Constitutional: Denies fever, chills, diaphoresis, appetite change and fatigue.  HEENT: Denies photophobia, eye pain, redness, hearing loss, ear pain, congestion, sore throat, rhinorrhea, sneezing, mouth sores, trouble swallowing, neck pain, neck stiffness and tinnitus.   Respiratory: Denies SOB, DOE, cough, chest tightness,  and wheezing.   Cardiovascular: Denies chest pain, palpitations and leg swelling.  Gastrointestinal: Denies nausea, vomiting, abdominal pain, diarrhea, constipation, blood in stool and abdominal distention.  Genitourinary: Denies dysuria, urgency,  frequency, hematuria, flank pain and difficulty urinating.  Endocrine: Denies: hot or cold intolerance, sweats, changes in hair or nails, polyuria, polydipsia. Musculoskeletal: Denies myalgias, back pain, joint swelling, arthralgias and gait problem.  Skin: Denies pallor, rash and wound.  Neurological: Denies dizziness, seizures, syncope, weakness, light-headedness, numbness and headaches.  Hematological: Denies adenopathy. Easy bruising, personal or family bleeding history  Psychiatric/Behavioral: Denies suicidal ideation, mood changes, confusion, nervousness, sleep disturbance and agitation    Physical Exam: Vitals:   08/27/20 1431  BP: 130/80  Pulse: (!) 59  Temp: 98.2 F (36.8 C)  TempSrc: Oral  SpO2: 96%  Weight: 113 lb 3.2 oz (51.3 kg)    Body mass index is 17.47 kg/m.   Constitutional: NAD, calm, comfortable Eyes: PERRL, lids and conjunctivae normal, wears corrective lenses ENMT: Mucous membranes are moist.  Respiratory: clear to auscultation bilaterally, no wheezing, no crackles. Normal respiratory effort. No accessory muscle use.  Cardiovascular: Regular rate and rhythm, no murmurs / rubs / gallops. No extremity edema.   Skin: no rashes, lesions, ulcers. No induration Neurologic: Grossly intact and nonfocal Psychiatric: Normal judgment and insight. Alert and oriented x 3. Normal mood.    Impression and Plan:  Preoperative clearance -Labs ordered as requested by surgeon including CBC, c-Met, coagulation studies. -EKG done in office today and interpreted by myself as normal sinus rhythm at a rate of 60, she does have some septal T wave inversions, no prior to compare.  This is probably normal for her age specifically in the absence of any CAD risk factors or symptoms. -Barring any major lab abnormalities, she is clear to proceed with surgery, she is class I risk which conveys the lowest 30-day probability of death cardiac arrest for MI following surgery.  Foreign  body of right index finger -Area was sterilized with an alcohol wipe, following this a small 11 blade was used to produce a small superficial puncture wound on the lateral side of the PIP joint.  Very small amount of pus was expressed as well as what appears to be a small splinter.  No immediate complications were noted, do not believe she needs antibiotic therapy.    Patient Instructions  -Nice seeing you today!!  -Lab work today; will notify you once results are available.  -After labs have been reviewed, we will clear you for surgery.     Lelon Frohlich, MD  Primary Care at [facility]

## 2020-08-28 LAB — CBC WITH DIFFERENTIAL/PLATELET
Absolute Monocytes: 290 cells/uL (ref 200–950)
Basophils Absolute: 38 cells/uL (ref 0–200)
Basophils Relative: 0.6 %
Eosinophils Absolute: 57 cells/uL (ref 15–500)
Eosinophils Relative: 0.9 %
HCT: 44.2 % (ref 35.0–45.0)
Hemoglobin: 15.3 g/dL (ref 11.7–15.5)
Lymphs Abs: 1550 cells/uL (ref 850–3900)
MCH: 32.8 pg (ref 27.0–33.0)
MCHC: 34.6 g/dL (ref 32.0–36.0)
MCV: 94.8 fL (ref 80.0–100.0)
MPV: 11.3 fL (ref 7.5–12.5)
Monocytes Relative: 4.6 %
Neutro Abs: 4366 cells/uL (ref 1500–7800)
Neutrophils Relative %: 69.3 %
Platelets: 150 10*3/uL (ref 140–400)
RBC: 4.66 10*6/uL (ref 3.80–5.10)
RDW: 12 % (ref 11.0–15.0)
Total Lymphocyte: 24.6 %
WBC: 6.3 10*3/uL (ref 3.8–10.8)

## 2020-08-28 LAB — PROTIME-INR
INR: 1
Prothrombin Time: 10.4 s (ref 9.0–11.5)

## 2020-08-28 LAB — APTT: aPTT: 28 s (ref 23–32)

## 2020-08-28 LAB — COMPREHENSIVE METABOLIC PANEL
AG Ratio: 2.6 (calc) — ABNORMAL HIGH (ref 1.0–2.5)
ALT: 19 U/L (ref 6–29)
AST: 22 U/L (ref 10–35)
Albumin: 4.6 g/dL (ref 3.6–5.1)
Alkaline phosphatase (APISO): 71 U/L (ref 37–153)
BUN: 17 mg/dL (ref 7–25)
CO2: 28 mmol/L (ref 20–32)
Calcium: 10.4 mg/dL (ref 8.6–10.4)
Chloride: 102 mmol/L (ref 98–110)
Creat: 0.95 mg/dL (ref 0.50–0.99)
Globulin: 1.8 g/dL (calc) — ABNORMAL LOW (ref 1.9–3.7)
Glucose, Bld: 86 mg/dL (ref 65–99)
Potassium: 3.9 mmol/L (ref 3.5–5.3)
Sodium: 138 mmol/L (ref 135–146)
Total Bilirubin: 0.7 mg/dL (ref 0.2–1.2)
Total Protein: 6.4 g/dL (ref 6.1–8.1)

## 2020-08-29 ENCOUNTER — Telehealth (INDEPENDENT_AMBULATORY_CARE_PROVIDER_SITE_OTHER): Payer: Self-pay | Admitting: Otolaryngology

## 2020-08-29 NOTE — Telephone Encounter (Signed)
Called [place] concerning results of recent temporal bone CT scan.  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  There was no posterior ear canal abnormality noted. When she returns to have her ears cleaned again in approximately 6 months I will review the CT scan whether and briefly discussed surgical options which would involve posterior ear canal meatal plasty.

## 2020-08-30 ENCOUNTER — Other Ambulatory Visit: Payer: Self-pay | Admitting: Internal Medicine

## 2020-08-30 DIAGNOSIS — Z1231 Encounter for screening mammogram for malignant neoplasm of breast: Secondary | ICD-10-CM

## 2020-08-31 ENCOUNTER — Encounter: Payer: Self-pay | Admitting: Internal Medicine

## 2020-10-09 ENCOUNTER — Encounter: Payer: Self-pay | Admitting: Podiatry

## 2020-10-09 ENCOUNTER — Other Ambulatory Visit: Payer: Self-pay

## 2020-10-09 ENCOUNTER — Ambulatory Visit (INDEPENDENT_AMBULATORY_CARE_PROVIDER_SITE_OTHER): Payer: Medicare Other | Admitting: Podiatry

## 2020-10-09 DIAGNOSIS — M779 Enthesopathy, unspecified: Secondary | ICD-10-CM | POA: Diagnosis not present

## 2020-10-09 DIAGNOSIS — B351 Tinea unguium: Secondary | ICD-10-CM | POA: Diagnosis not present

## 2020-10-09 NOTE — Progress Notes (Signed)
Subjective:   Patient ID: Nicole Snow, female   DOB: 69 y.o.   MRN: [id number]   HPI Patient presents that she has early irritation on her left second toe that she is concerned about and she is getting ready to leave the state for the next 2 months.  Also concerned about yellow discoloration of her nailbeds   ROS      Objective:  Physical Exam  Neurovascular status intact with a small inflammatory area at the inner phalangeal joint plantar digit to left with no indication of trauma with patient also noted to have thickened nailbeds 1-5 both feet that are mildly yellow but not painful     Assessment:  Inflammatory capsulitis second digit left with pain with mycotic nail infection of the nailbeds     Plan:  H&P reviewed condition and at this point I did do sterile prep and injected the inner phalangeal joint 2 mg Kenalog 3 mg Xylocaine I then discussed the fungal infection do not recommend treatment unless it were to become painful and she can use topical medicines which I instructed her on usage today

## 2020-10-14 ENCOUNTER — Other Ambulatory Visit: Payer: Self-pay

## 2020-10-14 ENCOUNTER — Ambulatory Visit
Admission: RE | Admit: 2020-10-14 | Discharge: 2020-10-14 | Disposition: A | Discharge status: Home / Self Care | Payer: Medicare Other | Source: Ambulatory Visit | Attending: Internal Medicine | Admitting: Internal Medicine

## 2020-10-14 DIAGNOSIS — Z1231 Encounter for screening mammogram for malignant neoplasm of breast: Secondary | ICD-10-CM

## 2020-10-15 ENCOUNTER — Encounter (INDEPENDENT_AMBULATORY_CARE_PROVIDER_SITE_OTHER): Payer: Self-pay | Admitting: Otolaryngology

## 2020-10-15 ENCOUNTER — Ambulatory Visit (INDEPENDENT_AMBULATORY_CARE_PROVIDER_SITE_OTHER): Payer: Medicare Other | Admitting: Otolaryngology

## 2020-10-15 VITALS — Temp 97.2°F

## 2020-10-15 DIAGNOSIS — H6123 Impacted cerumen, bilateral: Secondary | ICD-10-CM

## 2020-10-15 NOTE — Progress Notes (Signed)
HPI: Nicole Snow is a 69 y.o. female who returns today for evaluation of wax buildup in her ears.  Especially on the left side which is very narrow.  She had a CT scan performed a couple of months ago that showed bad deterioration of the left TMJ joint and that it extended posteriorly to partially obstruct the ear canal.  She has a very narrowed left ear canal compared to the right..  Past Medical History:  Diagnosis Date  . Allergy   . C. difficile colitis 08/2016   Past Surgical History:  Procedure Laterality Date  . CHOLECYSTECTOMY    . COLONOSCOPY  02/27/2009   T A polyp  . HYSTEROSCOPY WITH D & C  1992  . LASIK Bilateral   . REPLACEMENT TOTAL HIP W/  RESURFACING IMPLANTS    . TENDON REPAIR Right 1982  . TENDON REPAIR     left hand ring finge at age 89 yr old  . TONSILLECTOMY    . WISDOM TOOTH EXTRACTION     Social History   Socioeconomic History  . Marital status: Married    Spouse name: Not on file  . Number of children: Not on file  . Years of education: Not on file  . Highest education level: Not on file  Occupational History  . Not on file  Tobacco Use  . Smoking status: Never Smoker  . Smokeless tobacco: Never Used  Vaping Use  . Vaping Use: Never used  Substance and Sexual Activity  . Alcohol use: Yes    Alcohol/week: 7.0 standard drinks    Types: 7 Glasses of wine per week    Comment: beer/wine  . Drug use: Never  . Sexual activity: Not on file  Other Topics Concern  . Not on file  Social History Narrative  . Not on file   Social Determinants of Health   Financial Resource Strain: Not on file  Food Insecurity: Not on file  Transportation Needs: Not on file  Physical Activity: Not on file  Stress: Not on file  Social Connections: Not on file   Family History  Problem Relation Age of Onset  . Dementia Mother   . CVA Father   . COPD Father   . Colon cancer Neg Hx   . Rectal cancer Neg Hx   . Stomach cancer Neg Hx    Allergies   Allergen Reactions  . Ciprofloxacin Hives  . Clindamycin/Lincomycin Hives  . Flagyl [Metronidazole] Hives  . Hydrocodone Hives  . Penicillins    Prior to Admission medications   Medication Sig Start Date End Date Taking? Authorizing Provider  aspirin 81 MG chewable tablet Please take 1 tablet each day with food for the first 30 days after surgery 07/25/20   [provider]  CALCIUM PO Take 1 tablet by mouth daily.    [provider]  cetirizine (ZYRTEC) 10 MG tablet Take by mouth.    [provider]  cyanocobalamin 100 MCG tablet Take by mouth.    [provider]  fluticasone (FLONASE) 50 MCG/ACT nasal spray Place into the nose.    [provider]  Ginkgo 60 MG TABS Take by mouth.    [provider]  Magnesium 100 MG CAPS Take by mouth.    [provider]  meloxicam (MOBIC) 7.5 MG tablet Take 7.5 mg by mouth daily. 07/25/20   [provider]  Multiple Vitamins-Minerals (EMERGEN-C IMMUNE PO) Take by mouth.    [provider]  Probiotic  Product (PROBIOTIC PO) Take 1 tablet by mouth daily.    [provider]  terbinafine (LAMISIL) 250 MG tablet Please take one a day x 7days, repeat every 4 weeks x 4 months 05/29/20   Regal, Tamala Fothergill, DPM  Turmeric (QC TUMERIC COMPLEX PO) Take by mouth.    [provider]     Positive ROS: Otherwise negative  All other systems have been reviewed and were otherwise negative with the exception of those mentioned in the HPI and as above.  Physical Exam: Constitutional: Alert, well-appearing, no acute distress Ears: External ears without lesions or tenderness.  Left ear canal is very narrowed with slitlike opening.  Wax was removed from the ear canal with a curette.  Right ear canal was more normal size and this right ear canal was also cleaned with a curette.  TMs were clear. Nasal: External nose without lesions. . Clear nasal passages Oral: Lips and gums without  lesions. Tongue and palate mucosa without lesions. Posterior oropharynx clear. Neck: No palpable adenopathy or masses Respiratory: Breathing comfortably  Skin: No facial/neck lesions or rash noted.  Cerumen impaction removal  Date/Time: 10/15/2020 3:10 PM Performed by: Rozetta Nunnery, MD Authorized by: Rozetta Nunnery, MD   Consent:    Consent obtained:  Verbal   Consent given by:  Patient   Risks discussed:  Pain and bleeding Procedure details:    Location:  L ear and R ear   Procedure type: curette   Post-procedure details:    Inspection:  TM intact and canal normal   Hearing quality:  Improved   Patient tolerance of procedure:  Tolerated well, no immediate complications Comments:     Patient with a narrowed slitlike left ear canal secondary to abnormality of the left TMJ joint.    Assessment: Wax buildup. 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  Plan: Briefly discussed with her concerning options of surgery which would involve left ear canal canal plasty in order to enlarge ear canal but I would avoid the TMJ joint as this can make symptoms worse for the TMJ joint. The other alternative would be to routinely clean the ears every 3 to 4 months and she would prefer to do this. She will follow-up as needed.   Radene Journey, MD

## 2020-12-11 ENCOUNTER — Encounter: Payer: Self-pay | Admitting: Internal Medicine

## 2020-12-11 DIAGNOSIS — Z13820 Encounter for screening for osteoporosis: Secondary | ICD-10-CM

## 2020-12-30 ENCOUNTER — Other Ambulatory Visit: Payer: Self-pay

## 2020-12-31 ENCOUNTER — Encounter: Payer: Self-pay | Admitting: Internal Medicine

## 2020-12-31 ENCOUNTER — Ambulatory Visit (INDEPENDENT_AMBULATORY_CARE_PROVIDER_SITE_OTHER): Payer: Medicare Other | Admitting: Internal Medicine

## 2020-12-31 VITALS — BP 140/90 | HR 64 | Temp 97.6°F | Wt 116.2 lb

## 2020-12-31 DIAGNOSIS — M1612 Unilateral primary osteoarthritis, left hip: Secondary | ICD-10-CM

## 2020-12-31 DIAGNOSIS — R9431 Abnormal electrocardiogram [ECG] [EKG]: Secondary | ICD-10-CM | POA: Diagnosis not present

## 2020-12-31 DIAGNOSIS — R030 Elevated blood-pressure reading, without diagnosis of hypertension: Secondary | ICD-10-CM | POA: Diagnosis not present

## 2020-12-31 NOTE — Patient Instructions (Signed)
-  Nice seeing you today!!  -Schedule follow up in 8 weeks for your physical and for BP check. Bring in your BP measurements.

## 2020-12-31 NOTE — Progress Notes (Signed)
Established Patient Office Visit     This visit occurred during the SARS-CoV-2 public health emergency.  Safety protocols were in place, including screening questions prior to the visit, additional usage of staff PPE, and extensive cleaning of exam room while observing appropriate contact time as indicated for disinfecting solutions.    CC/Reason for Visit: Follow-up abnormal EKG  HPI: Nicole Snow is a 70 y.o. female who is coming in today for the above mentioned reasons.  She has no past medical history other than hip osteoarthritis for which she underwent replacement last year.  She recovered well from that.  During her preoperative clearance appointment she had an EKG during which she was found to have some septal T wave inversions that is probably a normal variant.  She recently had a friend that had a massive heart attack while playing pickle ball and is now interested in doing "everything possible to prevent that".  She has no acute complaints today.  She is now getting back to pickleball after taking 5 months recovery time from her hip replacement.  She has no chest pain or dyspnea on exertion.  Past Medical/Surgical History: Past Medical History:  Diagnosis Date  . Allergy   . C. difficile colitis 08/2016    Past Surgical History:  Procedure Laterality Date  . CHOLECYSTECTOMY    . COLONOSCOPY  02/27/2009   T A polyp  . HYSTEROSCOPY WITH D & C  1992  . LASIK Bilateral   . REPLACEMENT TOTAL HIP W/  RESURFACING IMPLANTS    . TENDON REPAIR Right 1982  . TENDON REPAIR     left hand ring finge at age 24 yr old  . TONSILLECTOMY    . WISDOM TOOTH EXTRACTION      Social History:  reports that she has never smoked. She has never used smokeless tobacco. She reports current alcohol use of about 7.0 standard drinks of alcohol per week. She reports that she does not use drugs.  Allergies: Allergies  Allergen Reactions  . Ciprofloxacin Hives  .  Clindamycin/Lincomycin Hives  . Flagyl [Metronidazole] Hives  . Hydrocodone Hives  . Penicillins     Family History:  Family History  Problem Relation Age of Onset  . Dementia Mother   . CVA Father   . COPD Father   . Colon cancer Neg Hx   . Rectal cancer Neg Hx   . Stomach cancer Neg Hx      Current Outpatient Medications:  .  aspirin 81 MG chewable tablet, Please take 1 tablet each day with food for the first 30 days after surgery, Disp: , Rfl:  .  CALCIUM PO, Take 1 tablet by mouth daily., Disp: , Rfl:  .  cetirizine (ZYRTEC) 10 MG tablet, Take by mouth., Disp: , Rfl:  .  cyanocobalamin 100 MCG tablet, Take by mouth., Disp: , Rfl:  .  fluticasone (FLONASE) 50 MCG/ACT nasal spray, Place into the nose., Disp: , Rfl:  .  Ginkgo 60 MG TABS, Take by mouth., Disp: , Rfl:  .  Magnesium 100 MG CAPS, Take by mouth., Disp: , Rfl:  .  meloxicam (MOBIC) 7.5 MG tablet, Take 7.5 mg by mouth daily., Disp: , Rfl:  .  Multiple Vitamins-Minerals (EMERGEN-C IMMUNE PO), Take by mouth., Disp: , Rfl:  .  Probiotic Product (PROBIOTIC PO), Take 1 tablet by mouth daily., Disp: , Rfl:  .  terbinafine (LAMISIL) 250 MG tablet, Please take one a day x 7days, repeat every  4 weeks x 4 months, Disp: 28 tablet, Rfl: 0 .  Turmeric (QC TUMERIC COMPLEX PO), Take by mouth., Disp: , Rfl:   Review of Systems:  Constitutional: Denies fever, chills, diaphoresis, appetite change and fatigue.  HEENT: Denies photophobia, eye pain, redness, hearing loss, ear pain, congestion, sore throat, rhinorrhea, sneezing, mouth sores, trouble swallowing, neck pain, neck stiffness and tinnitus.   Respiratory: Denies SOB, DOE, cough, chest tightness,  and wheezing.   Cardiovascular: Denies chest pain, palpitations and leg swelling.  Gastrointestinal: Denies nausea, vomiting, abdominal pain, diarrhea, constipation, blood in stool and abdominal distention.  Genitourinary: Denies dysuria, urgency, frequency, hematuria, flank pain and  difficulty urinating.  Endocrine: Denies: hot or cold intolerance, sweats, changes in hair or nails, polyuria, polydipsia. Musculoskeletal: Denies myalgias, back pain, joint swelling, arthralgias and gait problem.  Skin: Denies pallor, rash and wound.  Neurological: Denies dizziness, seizures, syncope, weakness, light-headedness, numbness and headaches.  Hematological: Denies adenopathy. Easy bruising, personal or family bleeding history  Psychiatric/Behavioral: Denies suicidal ideation, mood changes, confusion, nervousness, sleep disturbance and agitation    Physical Exam: Vitals:   12/31/20 1330  BP: 140/90  Pulse: 64  Temp: 97.6 F (36.4 C)  TempSrc: Oral  SpO2: 99%  Weight: 116 lb 3.2 oz (52.7 kg)    Body mass index is 17.93 kg/m.   Constitutional: NAD, calm, comfortable Eyes: PERRL, lids and conjunctivae normal, wears corrective lenses ENMT: Mucous membranes are moist. Respiratory: clear to auscultation bilaterally, no wheezing, no crackles. Normal respiratory effort. No accessory muscle use.  Cardiovascular: Regular rate and rhythm, no murmurs / rubs / gallops. No extremity edema.  Neurologic: Grossly intact and nonfocal. Psychiatric: Normal judgment and insight. Alert and oriented x 3. Normal mood.    Impression and Plan:  Abnormal EKG  - Plan: EKG 12-Lead -EKG today shows normal sinus rhythm with V1 T wave inversions, likely normal variant. -Have advised that we further risk stratify her by checking her fasting lipids and by making sure her blood pressure is well controlled (see below). -Given absence of other symptoms, I do not believe that more invasive cardiac work-up is necessary at this time.  Unilateral primary osteoarthritis, left hip -Status post replacement, recovered well.  Elevated BP without diagnosis of hypertension -Blood pressure noted to be 140/90 today. -She has not been diagnosed as hypertensive in the past. -Have advised her to do ambulatory  blood pressure monitoring and return in 8 weeks for follow-up.    Patient Instructions  -Nice seeing you today!!  -Schedule follow up in 8 weeks for your physical and for BP check. Bring in your BP measurements.     Lelon Frohlich, MD [place] [facility]

## 2021-01-03 ENCOUNTER — Encounter: Payer: Self-pay | Admitting: Internal Medicine

## 2021-01-03 DIAGNOSIS — R9431 Abnormal electrocardiogram [ECG] [EKG]: Secondary | ICD-10-CM

## 2021-01-03 DIAGNOSIS — R079 Chest pain, unspecified: Secondary | ICD-10-CM

## 2021-01-06 ENCOUNTER — Other Ambulatory Visit: Payer: Medicare Other

## 2021-01-08 ENCOUNTER — Encounter: Payer: Self-pay | Admitting: Internal Medicine

## 2021-01-10 ENCOUNTER — Encounter: Payer: Self-pay | Admitting: Internal Medicine

## 2021-01-14 ENCOUNTER — Other Ambulatory Visit: Payer: Self-pay

## 2021-01-15 ENCOUNTER — Other Ambulatory Visit: Payer: Self-pay | Admitting: Internal Medicine

## 2021-01-15 ENCOUNTER — Encounter: Payer: Self-pay | Admitting: Internal Medicine

## 2021-01-15 ENCOUNTER — Ambulatory Visit (INDEPENDENT_AMBULATORY_CARE_PROVIDER_SITE_OTHER): Payer: Medicare Other | Admitting: Internal Medicine

## 2021-01-15 ENCOUNTER — Ambulatory Visit (INDEPENDENT_AMBULATORY_CARE_PROVIDER_SITE_OTHER): Payer: Medicare Other

## 2021-01-15 VITALS — BP 140/85 | HR 63 | Temp 97.4°F | Ht 68.0 in | Wt 111.4 lb

## 2021-01-15 DIAGNOSIS — Z13820 Encounter for screening for osteoporosis: Secondary | ICD-10-CM | POA: Diagnosis not present

## 2021-01-15 DIAGNOSIS — R079 Chest pain, unspecified: Secondary | ICD-10-CM | POA: Diagnosis not present

## 2021-01-15 DIAGNOSIS — E785 Hyperlipidemia, unspecified: Secondary | ICD-10-CM

## 2021-01-15 DIAGNOSIS — I10 Essential (primary) hypertension: Secondary | ICD-10-CM | POA: Diagnosis not present

## 2021-01-15 DIAGNOSIS — Z0000 Encounter for general adult medical examination without abnormal findings: Secondary | ICD-10-CM

## 2021-01-15 DIAGNOSIS — Z0001 Encounter for general adult medical examination with abnormal findings: Secondary | ICD-10-CM | POA: Diagnosis not present

## 2021-01-15 LAB — COMPREHENSIVE METABOLIC PANEL
ALT: 16 U/L (ref 0–35)
AST: 22 U/L (ref 0–37)
Albumin: 4.6 g/dL (ref 3.5–5.2)
Alkaline Phosphatase: 106 U/L (ref 39–117)
BUN: 11 mg/dL (ref 6–23)
CO2: 29 mEq/L (ref 19–32)
Calcium: 10 mg/dL (ref 8.4–10.5)
Chloride: 102 mEq/L (ref 96–112)
Creatinine, Ser: 0.71 mg/dL (ref 0.40–1.20)
GFR: 87.44 mL/min (ref >60.00)
Glucose, Bld: 86 mg/dL (ref 70–99)
Potassium: 4.4 mEq/L (ref 3.5–5.1)
Sodium: 138 mEq/L (ref 135–145)
Total Bilirubin: 1 mg/dL (ref 0.2–1.2)
Total Protein: 6.5 g/dL (ref 6.0–8.3)

## 2021-01-15 LAB — CBC WITH DIFFERENTIAL/PLATELET
Basophils Absolute: 0.1 10*3/uL (ref 0.0–0.1)
Basophils Relative: 1 % (ref 0.0–3.0)
Eosinophils Absolute: 0.2 10*3/uL (ref 0.0–0.7)
Eosinophils Relative: 3.1 % (ref 0.0–5.0)
HCT: 45.3 % (ref 36.0–46.0)
Hemoglobin: 15.4 g/dL — ABNORMAL HIGH (ref 12.0–15.0)
Lymphocytes Relative: 29.3 % (ref 12.0–46.0)
Lymphs Abs: 1.6 10*3/uL (ref 0.7–4.0)
MCHC: 34 g/dL (ref 30.0–36.0)
MCV: 91.1 fl (ref 78.0–100.0)
Monocytes Absolute: 0.3 10*3/uL (ref 0.1–1.0)
Monocytes Relative: 5.6 % (ref 3.0–12.0)
Neutro Abs: 3.4 10*3/uL (ref 1.4–7.7)
Neutrophils Relative %: 61 % (ref 43.0–77.0)
Platelets: 146 10*3/uL — ABNORMAL LOW (ref 150.0–400.0)
RBC: 4.98 Mil/uL (ref 3.87–5.11)
RDW: 13.2 % (ref 11.5–15.5)
WBC: 5.6 10*3/uL (ref 4.0–10.5)

## 2021-01-15 LAB — LIPID PANEL
Cholesterol: 254 mg/dL — ABNORMAL HIGH (ref 0–200)
HDL: 102.9 mg/dL (ref >39.00)
LDL Cholesterol: 138 mg/dL — ABNORMAL HIGH (ref 0–99)
NonHDL: 150.85
Total CHOL/HDL Ratio: 2
Triglycerides: 63 mg/dL (ref 0.0–149.0)
VLDL: 12.6 mg/dL (ref 0.0–40.0)

## 2021-01-15 MED ORDER — ATORVASTATIN CALCIUM 10 MG PO TABS
10.0000 mg | ORAL_TABLET | Freq: Every day | ORAL | 1 refills | Status: DC
Start: 2021-01-15 — End: 2021-03-26

## 2021-01-15 MED ORDER — HYDROCHLOROTHIAZIDE 25 MG PO TABS
25.0000 mg | ORAL_TABLET | Freq: Every day | ORAL | 1 refills | Status: DC
Start: 2021-01-15 — End: 2021-03-26

## 2021-01-15 NOTE — Progress Notes (Signed)
Established Patient Office Visit     This visit occurred during the SARS-CoV-2 public health emergency.  Safety protocols were in place, including screening questions prior to the visit, additional usage of staff PPE, and extensive cleaning of exam room while observing appropriate contact time as indicated for disinfecting solutions.    CC/Reason for Visit: Subsequent Medicare wellness visit and discuss acute concerns  HPI: Nicole Snow is a 70 y.o. female who is coming in today for the above mentioned reasons. Past Medical History is significant for: Elevated blood pressure without diagnosis of hypertension, osteoarthritis status post bilateral hip replacements this past year.  Last year she was recovering from her hip replacements for about 5 months, when she returned back to physical activity she felt like she might have pulled a muscle of her left rib cage.  She has been having chest pain ever since.  She has had a couple EKGs with some anterior T wave inversions but nothing else.  She is very active by playing pickle ball.  She has no coronary artery disease risk factors.  She brings in her ambulatory blood pressure measurements.  Her systolic blood pressures are between the mid 130s to the 832N with diastolics in the mid to upper 80s and 90s.  She has routine eye and dental care.  She has seen a dermatologist.   Past Medical/Surgical History: Past Medical History:  Diagnosis Date  . Allergy   . C. difficile colitis 08/2016    Past Surgical History:  Procedure Laterality Date  . CHOLECYSTECTOMY    . COLONOSCOPY  02/27/2009   T A polyp  . HYSTEROSCOPY WITH D & C  1992  . LASIK Bilateral   . REPLACEMENT TOTAL HIP W/  RESURFACING IMPLANTS    . TENDON REPAIR Right 1982  . TENDON REPAIR     left hand ring finge at age 84 yr old  . TONSILLECTOMY    . WISDOM TOOTH EXTRACTION      Social History:  reports that she has never smoked. She has never used smokeless  tobacco. She reports current alcohol use of about 7.0 standard drinks of alcohol per week. She reports that she does not use drugs.  Allergies: Allergies  Allergen Reactions  . Ciprofloxacin Hives  . Clindamycin/Lincomycin Hives  . Flagyl [Metronidazole] Hives  . Hydrocodone Hives  . Penicillins     Family History:  Family History  Problem Relation Age of Onset  . Dementia Mother   . CVA Father   . COPD Father   . Colon cancer Neg Hx   . Rectal cancer Neg Hx   . Stomach cancer Neg Hx      Current Outpatient Medications:  .  aspirin 81 MG chewable tablet, Please take 1 tablet each day with food for the first 30 days after surgery, Disp: , Rfl:  .  CALCIUM PO, Take 1 tablet by mouth daily., Disp: , Rfl:  .  cetirizine (ZYRTEC) 10 MG tablet, Take by mouth., Disp: , Rfl:  .  cyanocobalamin 100 MCG tablet, Take by mouth., Disp: , Rfl:  .  fluticasone (FLONASE) 50 MCG/ACT nasal spray, Place into the nose., Disp: , Rfl:  .  Ginkgo 60 MG TABS, Take by mouth., Disp: , Rfl:  .  hydrochlorothiazide (HYDRODIURIL) 25 MG tablet, Take 1 tablet (25 mg total) by mouth daily., Disp: 90 tablet, Rfl: 1 .  meloxicam (MOBIC) 7.5 MG tablet, Take 7.5 mg by mouth daily., Disp: , Rfl:  .  Multiple Vitamins-Minerals (EMERGEN-C IMMUNE PO), Take by mouth., Disp: , Rfl:  .  Probiotic Product (PROBIOTIC PO), Take 1 tablet by mouth daily., Disp: , Rfl:  .  Turmeric (QC TUMERIC COMPLEX PO), Take by mouth., Disp: , Rfl:   Review of Systems:  Constitutional: Denies fever, chills, diaphoresis, appetite change and fatigue.  HEENT: Denies photophobia, eye pain, redness, hearing loss, ear pain, congestion, sore throat, rhinorrhea, sneezing, mouth sores, trouble swallowing, neck pain, neck stiffness and tinnitus.   Respiratory: Denies SOB, DOE, cough, chest tightness,  and wheezing.   Cardiovascular: Denies  palpitations and leg swelling.  Gastrointestinal: Denies nausea, vomiting, abdominal pain, diarrhea,  constipation, blood in stool and abdominal distention.  Genitourinary: Denies dysuria, urgency, frequency, hematuria, flank pain and difficulty urinating.  Endocrine: Denies: hot or cold intolerance, sweats, changes in hair or nails, polyuria, polydipsia. Musculoskeletal: Denies myalgias, back pain, joint swelling, arthralgias and gait problem.  Skin: Denies pallor, rash and wound.  Neurological: Denies dizziness, seizures, syncope, weakness, light-headedness, numbness and headaches.  Hematological: Denies adenopathy. Easy bruising, personal or family bleeding history  Psychiatric/Behavioral: Denies suicidal ideation, mood changes, confusion, nervousness, sleep disturbance and agitation    Physical Exam: Vitals:   01/15/21 1310  BP: 140/85  Pulse: 63  Temp: (!) 97.4 F (36.3 C)  TempSrc: Oral  SpO2: 97%  Weight: 111 lb 6.4 oz (50.5 kg)  Height: 5' 8" (1.727 m)    Body mass index is 16.94 kg/m.   Constitutional: NAD, calm, comfortable Eyes: PERRL, lids and conjunctivae normal, wears corrective lenses ENMT: Mucous membranes are moist. Posterior pharynx clear of any exudate or lesions. Normal dentition. Tympanic membrane is pearly white, no erythema or bulging. Neck: normal, supple, no masses, no thyromegaly Respiratory: clear to auscultation bilaterally, no wheezing, no crackles. Normal respiratory effort. No accessory muscle use.  Cardiovascular: Regular rate and rhythm, no murmurs / rubs / gallops. No extremity edema. 2+ pedal pulses.  Abdomen: no tenderness, no masses palpated. No hepatosplenomegaly. Bowel sounds positive.  Musculoskeletal: no clubbing / cyanosis. No joint deformity upper and lower extremities. Good ROM, no contractures. Normal muscle tone.  Skin: no rashes, lesions, ulcers. No induration Neurologic: CN 2-12 grossly intact. Sensation intact, DTR normal. Strength 5/5 in all 4.  Psychiatric: Normal judgment and insight. Alert and oriented x 3. Normal mood.     Subsequent Medicare wellness visit   1. Risk factors, based on past  M,S,F -she has no coronary artery disease risk factors other than newly diagnosed hypertension   2.  Physical activities: She is very active with pickleball, tennis and walking   3.  Depression/mood:  Stable, not depressed   4.  Hearing:  No perceived issues   5.  ADL's: Independent in all ADLs   6.  Fall risk:  Low fall risk   7.  Home safety: No problems identified   8.  Height weight, and visual acuity: height and weight as above, vision:   Visual Acuity Screening   Right eye Left eye Both eyes  Without correction:     With correction: 20/25 20/40 20/20     9.  Counseling:  We have talked about low-sodium diet   10. Lab orders based on risk factors: Laboratory update will be reviewed   11. Referral :  None today   12. Care plan:  Follow-up with me in 8 weeks for blood pressure follow-up   13. Cognitive assessment:  No cognitive impairment   14. Screening: Patient provided with a written  and personalized 5-10 year screening schedule in the AVS.   yes   15. Provider List Update:   PCP, ENT Dr. Lucia Gaskins  16. Advance Directives: Full code   17. Opioids: Patient is not on any opioid prescriptions and has no risk factors for a substance use disorder.   [facility] Office Visit from 12/31/2020 in [place] [facility] [place]  PHQ-9 Total Score 1      Fall Risk  09/01/2019  Falls in the past year? 0  Number falls in past yr: 0  Injury with Fall? 0     Impression and Plan:  Encounter for subsequent annual wellness visit (AWV) in Medicare patient -She has routine eye and dental care. -All immunizations are up-to-date with the exception of her second shingles which she will obtain at her pharmacy soon. -Screening labs today. -Healthy lifestyle discussed in detail. -She will be due for Pap smear this year. -DEXA scan requested today. -She had a colonoscopy in 2021 and is a 5-year  callback. -She had a mammogram in December 2021.  Chest pain, unspecified type  - Plan: DG Ribs Bilateral -She had an EKG at previous visit that was normal with the exception of some V1 T wave inversions which could be a normal variant. -Per patient request she has an appointment with cardiology scheduled in April. -Her pretest probability for coronary artery disease is very low.  Her only coronary artery disease risk factor is her newly diagnosed hypertension. -We have discussed GI options, she declines any symptoms that could potentially be related to reflux. -Since pain started soon after her hip replacements when she was relying more on her upper body and she describes that it feels like "a pulled muscle", I will go ahead and do a detailed rib x-ray today.  Primary hypertension -New diagnosis today based on repeated elevated measurements in office and elevated home measurements. -Start hydrochlorothiazide 25 mg daily. -She will return in 8 weeks for follow-up with ambulatory blood pressure measurements.  - Plan: hydrochlorothiazide (HYDRODIURIL) 25 MG tablet, CBC with Differential/Platelet, Comprehensive metabolic panel, Lipid panel  Screening for osteoporosis  - Plan: DG Bone Density   Patient Instructions   -Nice seeing you today!!  -Lab work today; will notify you once results are available.  -xray today.  -Start HCTZ 25 mg daily.  -Return in 8 weeks for follow up with blood pressure measurements.   Preventive Care 70 Years and Older, Female Preventive care refers to lifestyle choices and visits with your health care provider that can promote health and wellness. This includes:  A yearly physical exam. This is also called an annual wellness visit.  Regular dental and eye exams.  Immunizations.  Screening for certain conditions.  Healthy lifestyle choices, such as: ? Eating a healthy diet. ? Getting regular exercise. ? Not using drugs or products that contain  nicotine and tobacco. ? Limiting alcohol use. What can I expect for my preventive care visit? Physical exam Your health care provider will check your:  Height and weight. These may be used to calculate your BMI (body mass index). BMI is a measurement that tells if you are at a healthy weight.  Heart rate and blood pressure.  Body temperature.  Skin for abnormal spots. Counseling Your health care provider may ask you questions about your:  Past medical problems.  Family's medical history.  Alcohol, tobacco, and drug use.  Emotional well-being.  Home life and relationship well-being.  Sexual activity.  Diet, exercise, and sleep habits.  History of falls.  Memory and ability to understand (cognition).  Work and work Statistician.  Pregnancy and menstrual history.  Access to firearms. What immunizations do I need? Vaccines are usually given at various ages, according to a schedule. Your health care provider will recommend vaccines for you based on your age, medical history, and lifestyle or other factors, such as travel or where you work.   What tests do I need? Blood tests  Lipid and cholesterol levels. These may be checked every 5 years, or more often depending on your overall health.  Hepatitis C test.  Hepatitis B test. Screening  Lung cancer screening. You may have this screening every year starting at age 8 if you have a 30-pack-year history of smoking and currently smoke or have quit within the past 15 years.  Colorectal cancer screening. ? All adults should have this screening starting at age 92 and continuing until age 62. ? Your health care provider may recommend screening at age 82 if you are at increased risk. ? You will have tests every 1-10 years, depending on your results and the type of screening test.  Diabetes screening. ? This is done by checking your blood sugar (glucose) after you have not eaten for a while (fasting). ? You may have this  done every 1-3 years.  Mammogram. ? This may be done every 1-2 years. ? Talk with your health care provider about how often you should have regular mammograms.  Abdominal aortic aneurysm (AAA) screening. You may need this if you are a current or former smoker.  BRCA-related cancer screening. This may be done if you have a family history of breast, ovarian, tubal, or peritoneal cancers. Other tests  STD (sexually transmitted disease) testing, if you are at risk.  Bone density scan. This is done to screen for osteoporosis. You may have this done starting at age 41. Talk with your health care provider about your test results, treatment options, and if necessary, the need for more tests. Follow these instructions at home: Eating and drinking  Eat a diet that includes fresh fruits and vegetables, whole grains, lean protein, and low-fat dairy products. Limit your intake of foods with high amounts of sugar, saturated fats, and salt.  Take vitamin and mineral supplements as recommended by your health care provider.  Do not drink alcohol if your health care provider tells you not to drink.  If you drink alcohol: ? Limit how much you have to 0-1 drink a day. ? Be aware of how much alcohol is in your drink. In the [place] one drink equals one 12 oz bottle of beer (355 mL), one 5 oz glass of wine (148 mL), or one 1 oz glass of hard liquor (44 mL).   Lifestyle  Take daily care of your teeth and gums. Brush your teeth every morning and night with fluoride toothpaste. Floss one time each day.  Stay active. Exercise for at least 30 minutes 5 or more days each week.  Do not use any products that contain nicotine or tobacco, such as cigarettes, e-cigarettes, and chewing tobacco. If you need help quitting, ask your health care provider.  Do not use drugs.  If you are sexually active, practice safe sex. Use a condom or other form of protection in order to prevent STIs (sexually transmitted  infections).  Talk with your health care provider about taking a low-dose aspirin or statin.  Find healthy ways to cope with stress, such as: ? Meditation, yoga, or listening  to music. ? Journaling. ? Talking to a trusted person. ? Spending time with friends and family. Safety  Always wear your seat belt while driving or riding in a vehicle.  Do not drive: ? If you have been drinking alcohol. Do not ride with someone who has been drinking. ? When you are tired or distracted. ? While texting.  Wear a helmet and other protective equipment during sports activities.  If you have firearms in your house, make sure you follow all gun safety procedures. What's next?  Visit your health care provider once a year for an annual wellness visit.  Ask your health care provider how often you should have your eyes and teeth checked.  Stay up to date on all vaccines. This information is not intended to replace advice given to you by your health care provider. Make sure you discuss any questions you have with your health care provider. Document Revised: 10/02/2020 Document Reviewed: 10/06/2018 Elsevier Patient Education  2021 [place], [place] [facility]

## 2021-01-15 NOTE — Patient Instructions (Signed)
-Nice seeing you today!!  -Lab work today; will notify you once results are available.  -xray today.  -Start HCTZ 25 mg daily.  -Return in 8 weeks for follow up with blood pressure measurements.   Preventive Care 69 Years and Older, Female Preventive care refers to lifestyle choices and visits with your health care provider that can promote health and wellness. This includes:  A yearly physical exam. This is also called an annual wellness visit.  Regular dental and eye exams.  Immunizations.  Screening for certain conditions.  Healthy lifestyle choices, such as: ? Eating a healthy diet. ? Getting regular exercise. ? Not using drugs or products that contain nicotine and tobacco. ? Limiting alcohol use. What can I expect for my preventive care visit? Physical exam Your health care provider will check your:  Height and weight. These may be used to calculate your BMI (body mass index). BMI is a measurement that tells if you are at a healthy weight.  Heart rate and blood pressure.  Body temperature.  Skin for abnormal spots. Counseling Your health care provider may ask you questions about your:  Past medical problems.  Family's medical history.  Alcohol, tobacco, and drug use.  Emotional well-being.  Home life and relationship well-being.  Sexual activity.  Diet, exercise, and sleep habits.  History of falls.  Memory and ability to understand (cognition).  Work and work Statistician.  Pregnancy and menstrual history.  Access to firearms. What immunizations do I need? Vaccines are usually given at various ages, according to a schedule. Your health care provider will recommend vaccines for you based on your age, medical history, and lifestyle or other factors, such as travel or where you work.   What tests do I need? Blood tests  Lipid and cholesterol levels. These may be checked every 5 years, or more often depending on your overall health.  Hepatitis C  test.  Hepatitis B test. Screening  Lung cancer screening. You may have this screening every year starting at age 69 if you have a 30-pack-year history of smoking and currently smoke or have quit within the past 15 years.  Colorectal cancer screening. ? All adults should have this screening starting at age 69 and continuing until age 39. ? Your health care provider may recommend screening at age 19 if you are at increased risk. ? You will have tests every 1-10 years, depending on your results and the type of screening test.  Diabetes screening. ? This is done by checking your blood sugar (glucose) after you have not eaten for a while (fasting). ? You may have this done every 1-3 years.  Mammogram. ? This may be done every 1-2 years. ? Talk with your health care provider about how often you should have regular mammograms.  Abdominal aortic aneurysm (AAA) screening. You may need this if you are a current or former smoker.  BRCA-related cancer screening. This may be done if you have a family history of breast, ovarian, tubal, or peritoneal cancers. Other tests  STD (sexually transmitted disease) testing, if you are at risk.  Bone density scan. This is done to screen for osteoporosis. You may have this done starting at age 69. Talk with your health care provider about your test results, treatment options, and if necessary, the need for more tests. Follow these instructions at home: Eating and drinking  Eat a diet that includes fresh fruits and vegetables, whole grains, lean protein, and low-fat dairy products. Limit your intake of foods  with high amounts of sugar, saturated fats, and salt.  Take vitamin and mineral supplements as recommended by your health care provider.  Do not drink alcohol if your health care provider tells you not to drink.  If you drink alcohol: ? Limit how much you have to 0-1 drink a day. ? Be aware of how much alcohol is in your drink. In the [place] one  drink equals one 12 oz bottle of beer (355 mL), one 5 oz glass of wine (148 mL), or one 1 oz glass of hard liquor (44 mL).   Lifestyle  Take daily care of your teeth and gums. Brush your teeth every morning and night with fluoride toothpaste. Floss one time each day.  Stay active. Exercise for at least 30 minutes 5 or more days each week.  Do not use any products that contain nicotine or tobacco, such as cigarettes, e-cigarettes, and chewing tobacco. If you need help quitting, ask your health care provider.  Do not use drugs.  If you are sexually active, practice safe sex. Use a condom or other form of protection in order to prevent STIs (sexually transmitted infections).  Talk with your health care provider about taking a low-dose aspirin or statin.  Find healthy ways to cope with stress, such as: ? Meditation, yoga, or listening to music. ? Journaling. ? Talking to a trusted person. ? Spending time with friends and family. Safety  Always wear your seat belt while driving or riding in a vehicle.  Do not drive: ? If you have been drinking alcohol. Do not ride with someone who has been drinking. ? When you are tired or distracted. ? While texting.  Wear a helmet and other protective equipment during sports activities.  If you have firearms in your house, make sure you follow all gun safety procedures. What's next?  Visit your health care provider once a year for an annual wellness visit.  Ask your health care provider how often you should have your eyes and teeth checked.  Stay up to date on all vaccines. This information is not intended to replace advice given to you by your health care provider. Make sure you discuss any questions you have with your health care provider. Document Revised: 10/02/2020 Document Reviewed: 10/06/2018 Elsevier Patient Education  2021 [employer].

## 2021-01-16 ENCOUNTER — Other Ambulatory Visit: Payer: Self-pay | Admitting: Internal Medicine

## 2021-01-16 DIAGNOSIS — E785 Hyperlipidemia, unspecified: Secondary | ICD-10-CM

## 2021-01-21 ENCOUNTER — Other Ambulatory Visit: Payer: Self-pay

## 2021-01-21 ENCOUNTER — Ambulatory Visit (INDEPENDENT_AMBULATORY_CARE_PROVIDER_SITE_OTHER)
Admission: RE | Admit: 2021-01-21 | Discharge: 2021-01-21 | Disposition: A | Discharge status: Home / Self Care | Payer: Medicare Other | Source: Ambulatory Visit | Attending: Internal Medicine | Admitting: Internal Medicine

## 2021-01-21 DIAGNOSIS — Z13820 Encounter for screening for osteoporosis: Secondary | ICD-10-CM | POA: Diagnosis not present

## 2021-01-27 ENCOUNTER — Encounter: Payer: Self-pay | Admitting: Cardiovascular Disease

## 2021-01-27 ENCOUNTER — Other Ambulatory Visit: Payer: Self-pay

## 2021-01-27 ENCOUNTER — Ambulatory Visit (INDEPENDENT_AMBULATORY_CARE_PROVIDER_SITE_OTHER): Payer: Medicare Other | Admitting: Cardiovascular Disease

## 2021-01-27 VITALS — BP 136/78 | HR 58 | Ht 68.0 in | Wt 117.0 lb

## 2021-01-27 DIAGNOSIS — R072 Precordial pain: Secondary | ICD-10-CM | POA: Diagnosis not present

## 2021-01-27 NOTE — Progress Notes (Signed)
Chief Complaint  Patient presents with  . New Patient (Initial Visit)    Chest pain   History of Present Illness: 70 yo female with history of HTN, hyperlipidemia and arthritis with recent hip replacement who is here today for evaluation of left chest wall pain. She tells me today that her chest pain started 2 months ago. She was recovering from her hip replacement. She recalls her pain starting in the left chest wall after a hard workout. The pain seemed to occur when she was moving her arms or leaning over. This occurs for one second, several times per day. She has no exertional chest pain. No associated dyspnea or diaphoresis. The pain is usually over her left chest. EKG from 12/31/20 in primary care with sinus and T wave inversions in leads V1 and V2, unchanged from EKG in 2021. She is very active. She plays pickle ball 2.5 hours per day with no issues.   Primary Care Physician: Isaac Bliss, Rayford Halsted, MD   Past Medical History:  Diagnosis Date  . Allergy   . C. difficile colitis 08/2016  . HTN (hypertension)     Past Surgical History:  Procedure Laterality Date  . CHOLECYSTECTOMY    . COLONOSCOPY  02/27/2009   T A polyp  . HYSTEROSCOPY WITH D & C  1992  . LASIK Bilateral   . REPLACEMENT TOTAL HIP W/  RESURFACING IMPLANTS    . TENDON REPAIR Right 1982  . TENDON REPAIR     left hand ring finge at age 20 yr old  . TONSILLECTOMY    . WISDOM TOOTH EXTRACTION      Current Outpatient Medications  Medication Sig Dispense Refill  . atorvastatin (LIPITOR) 10 MG tablet Take 1 tablet (10 mg total) by mouth daily. 90 tablet 1  . CALCIUM PO Take 500 mg by mouth daily.    . cetirizine (ZYRTEC) 10 MG tablet Take by mouth.    . Coenzyme Q10 (CO Q-10) 400 MG CAPS Take 1 capsule by mouth daily.    . cyanocobalamin 1000 MCG tablet Take 1,000 mcg by mouth daily.    . fluticasone (FLONASE) 50 MCG/ACT nasal spray Place 1 spray into both nostrils as needed.    . Ginkgo 60 MG TABS Take 1  tablet by mouth daily.    . hydrochlorothiazide (HYDRODIURIL) 25 MG tablet Take 1 tablet (25 mg total) by mouth daily. 90 tablet 1  . magnesium oxide (MAG-OX) 400 MG tablet Take 400 mg by mouth daily.    . meloxicam (MOBIC) 7.5 MG tablet Take 7.5 mg by mouth daily.    . Multiple Vitamins-Minerals (EMERGEN-C IMMUNE PO) Take 1 capsule by mouth daily.    . Probiotic Product (PROBIOTIC PO) Take 1 tablet by mouth daily.    . Turmeric (QC TUMERIC COMPLEX PO) Take by mouth.     No current facility-administered medications for this visit.    Allergies  Allergen Reactions  . Ciprofloxacin Hives  . Clindamycin/Lincomycin Hives  . Flagyl [Metronidazole] Hives  . Hydrocodone Hives  . Penicillins     Social History   Socioeconomic History  . Marital status: Married    Spouse name: Not on file  . Number of children: 1  . Years of education: Not on file  . Highest education level: Not on file  Occupational History  . Occupation: retired-realtor  Tobacco Use  . Smoking status: Never Smoker  . Smokeless tobacco: Never Used  Vaping Use  . Vaping Use: Never  used  Substance and Sexual Activity  . Alcohol use: Yes    Alcohol/week: 7.0 standard drinks    Types: 7 Glasses of wine per week    Comment: beer/wine  . Drug use: Never  . Sexual activity: Not on file  Other Topics Concern  . Not on file  Social History Narrative  . Not on file   Social Determinants of Health   Financial Resource Strain: Not on file  Food Insecurity: Not on file  Transportation Needs: Not on file  Physical Activity: Not on file  Stress: Not on file  Social Connections: Not on file  Intimate Partner Violence: Not on file    Family History  Problem Relation Age of Onset  . Dementia Mother   . CVA Father 55  . COPD Father   . Colon cancer Neg Hx   . Rectal cancer Neg Hx   . Stomach cancer Neg Hx     Review of Systems:  As stated in the HPI and otherwise negative.   BP 136/78   Pulse (!) 58   Ht  5\' 8"  (1.727 m)   Wt 117 lb (53.1 kg)   LMP  (LMP Unknown)   SpO2 99%   BMI 17.79 kg/m   Physical Examination: General: Well developed, well nourished, NAD  HEENT: OP clear, mucus membranes moist  SKIN: warm, dry. No rashes. Neuro: No focal deficits  Musculoskeletal: Muscle strength 5/5 all ext  Psychiatric: Mood and affect normal  Neck: No JVD, no carotid bruits, no thyromegaly, no lymphadenopathy.  Lungs:Clear bilaterally, no wheezes, rhonci, crackles Cardiovascular: Regular rate and rhythm. No murmurs, gallops or rubs. Abdomen:Soft. Bowel sounds present. Non-tender.  Extremities: No lower extremity edema. Pulses are 2 + in the bilateral DP/PT.  EKG:  EKG is ordered today. The ekg ordered today demonstrates sinus, T wave inversion V1, V2  Recent Labs: 01/15/2021: ALT 16; BUN 11; Creatinine, Ser 0.71; Hemoglobin 15.4; Platelets 146.0; Potassium 4.4; Sodium 138   Lipid Panel    Component Value Date/Time   CHOL 254 (H) 01/15/2021 1356   TRIG 63.0 01/15/2021 1356   HDL 102.90 01/15/2021 1356   CHOLHDL 2 01/15/2021 1356   VLDL 12.6 01/15/2021 1356   LDLCALC 138 (H) 01/15/2021 1356     Wt Readings from Last 3 Encounters:  01/27/21 117 lb (53.1 kg)  01/15/21 111 lb 6.4 oz (50.5 kg)  12/31/20 116 lb 3.2 oz (52.7 kg)     Assessment and Plan:   1. Chest pain: Her chest pain does not seem cardiac. Her risk factors for CAD would be new diagnosis of HTN and mildly elevated lipids. I will arrange an echo to assess LVEF, exclude structural heart disease.   Current medicines are reviewed at length with the patient today.  The patient does not have concerns regarding medicines.  The following changes have been made:  no change  Labs/ tests ordered today include:   Orders Placed This Encounter  Procedures  . EKG 12-Lead  . ECHOCARDIOGRAM COMPLETE     Disposition:   FU with me in one year.    Signed, Lauree Chandler, MD 01/27/2021 9:14 AM    [facility] [address], [place], [place]  [address] Phone: [phone]; Fax: [phone]

## 2021-01-27 NOTE — Patient Instructions (Signed)
Medication Instructions:  No changes *If you need a refill on your cardiac medications before your next appointment, please call your pharmacy*   Lab Work: none   Testing/Procedures: Your physician has requested that you have an echocardiogram. Echocardiography is a painless test that uses sound waves to create images of your heart. It provides your doctor with information about the size and shape of your heart and how well your heart's chambers and valves are working. This procedure takes approximately one hour. There are no restrictions for this procedure.   Follow-Up: At [facility], you and your health needs are our priority.  As part of our continuing mission to provide you with exceptional heart care, we have created designated Provider Care Teams.  These Care Teams include your primary Cardiologist (physician) and Advanced Practice Providers (APPs -  Physician Assistants and Nurse Practitioners) who all work together to provide you with the care you need, when you need it.   Your next appointment:   12 month(s)  The format for your next appointment:   In Person  Provider:   You may see Lauree Chandler, MD or one of the following Advanced Practice Providers on your designated Care Team:    Melina Copa, PA-C  Ermalinda Barrios, PA-C

## 2021-01-28 ENCOUNTER — Encounter: Payer: Self-pay | Admitting: Internal Medicine

## 2021-01-28 DIAGNOSIS — M810 Age-related osteoporosis without current pathological fracture: Secondary | ICD-10-CM | POA: Insufficient documentation

## 2021-01-30 ENCOUNTER — Ambulatory Visit: Payer: Medicare Other | Admitting: Podiatry

## 2021-01-30 ENCOUNTER — Other Ambulatory Visit: Payer: Self-pay

## 2021-01-30 ENCOUNTER — Ambulatory Visit (INDEPENDENT_AMBULATORY_CARE_PROVIDER_SITE_OTHER): Payer: Medicare Other | Admitting: Internal Medicine

## 2021-01-30 ENCOUNTER — Encounter: Payer: Self-pay | Admitting: Internal Medicine

## 2021-01-30 VITALS — BP 102/74 | HR 50 | Temp 98.0°F | Wt 116.5 lb

## 2021-01-30 DIAGNOSIS — M810 Age-related osteoporosis without current pathological fracture: Secondary | ICD-10-CM | POA: Diagnosis not present

## 2021-01-30 MED ORDER — ALENDRONATE SODIUM 70 MG PO TABS: 70.0000 mg | ORAL_TABLET | ORAL | 11 refills | Status: DC

## 2021-01-30 NOTE — Progress Notes (Signed)
Established Patient Office Visit     This visit occurred during the SARS-CoV-2 public health emergency.  Safety protocols were in place, including screening questions prior to the visit, additional usage of staff PPE, and extensive cleaning of exam room while observing appropriate contact time as indicated for disinfecting solutions.    CC/Reason for Visit: Discuss osteoporosis management  HPI: Nicole Snow is a 71 y.o. female who is coming in today for the above mentioned reasons.  She was found to have osteoporosis on her recent DEXA scan in March with a T score of -3.1 today.  She is here today to discuss management.  Past Medical/Surgical History: Past Medical History:  Diagnosis Date  . Allergy   . C. difficile colitis 08/2016  . HTN (hypertension)     Past Surgical History:  Procedure Laterality Date  . CHOLECYSTECTOMY    . COLONOSCOPY  02/27/2009   T A polyp  . HYSTEROSCOPY WITH D & C  1992  . LASIK Bilateral   . REPLACEMENT TOTAL HIP W/  RESURFACING IMPLANTS    . TENDON REPAIR Right 1982  . TENDON REPAIR     left hand ring finge at age 89 yr old  . TONSILLECTOMY    . WISDOM TOOTH EXTRACTION      Social History:  reports that she has never smoked. She has never used smokeless tobacco. She reports current alcohol use of about 7.0 standard drinks of alcohol per week. She reports that she does not use drugs.  Allergies: Allergies  Allergen Reactions  . Ciprofloxacin Hives  . Clindamycin/Lincomycin Hives  . Flagyl [Metronidazole] Hives  . Hydrocodone Hives  . Penicillins     Family History:  Family History  Problem Relation Age of Onset  . Dementia Mother   . CVA Father 48  . COPD Father   . Colon cancer Neg Hx   . Rectal cancer Neg Hx   . Stomach cancer Neg Hx      Current Outpatient Medications:  .  alendronate (FOSAMAX) 70 MG tablet, Take 1 tablet (70 mg total) by mouth every 7 (seven) days. Take with a full glass of water on an  empty stomach., Disp: 4 tablet, Rfl: 11 .  atorvastatin (LIPITOR) 10 MG tablet, Take 1 tablet (10 mg total) by mouth daily., Disp: 90 tablet, Rfl: 1 .  CALCIUM PO, Take 500 mg by mouth daily., Disp: , Rfl:  .  cetirizine (ZYRTEC) 10 MG tablet, Take by mouth., Disp: , Rfl:  .  Coenzyme Q10 (CO Q-10) 400 MG CAPS, Take 1 capsule by mouth daily., Disp: , Rfl:  .  cyanocobalamin 1000 MCG tablet, Take 1,000 mcg by mouth daily., Disp: , Rfl:  .  fluticasone (FLONASE) 50 MCG/ACT nasal spray, Place 1 spray into both nostrils as needed., Disp: , Rfl:  .  Ginkgo 60 MG TABS, Take 1 tablet by mouth daily., Disp: , Rfl:  .  hydrochlorothiazide (HYDRODIURIL) 25 MG tablet, Take 1 tablet (25 mg total) by mouth daily., Disp: 90 tablet, Rfl: 1 .  magnesium oxide (MAG-OX) 400 MG tablet, Take 400 mg by mouth daily., Disp: , Rfl:  .  meloxicam (MOBIC) 7.5 MG tablet, Take 7.5 mg by mouth daily., Disp: , Rfl:  .  Multiple Vitamins-Minerals (EMERGEN-C IMMUNE PO), Take 1 capsule by mouth daily., Disp: , Rfl:  .  Probiotic Product (PROBIOTIC PO), Take 1 tablet by mouth daily., Disp: , Rfl:  .  Turmeric (QC TUMERIC COMPLEX PO), Take  by mouth., Disp: , Rfl:   Review of Systems:  Constitutional: Denies fever, chills, diaphoresis, appetite change and fatigue.  HEENT: Denies photophobia, eye pain, redness, hearing loss, ear pain, congestion, sore throat, rhinorrhea, sneezing, mouth sores, trouble swallowing, neck pain, neck stiffness and tinnitus.   Respiratory: Denies SOB, DOE, cough, chest tightness,  and wheezing.   Cardiovascular: Denies chest pain, palpitations and leg swelling.  Gastrointestinal: Denies nausea, vomiting, abdominal pain, diarrhea, constipation, blood in stool and abdominal distention.  Genitourinary: Denies dysuria, urgency, frequency, hematuria, flank pain and difficulty urinating.  Endocrine: Denies: hot or cold intolerance, sweats, changes in hair or nails, polyuria, polydipsia. Musculoskeletal:  Denies myalgias, back pain, joint swelling, arthralgias and gait problem.  Skin: Denies pallor, rash and wound.  Neurological: Denies dizziness, seizures, syncope, weakness, light-headedness, numbness and headaches.  Hematological: Denies adenopathy. Easy bruising, personal or family bleeding history  Psychiatric/Behavioral: Denies suicidal ideation, mood changes, confusion, nervousness, sleep disturbance and agitation    Physical Exam: Vitals:   01/30/21 1520  BP: 102/74  Pulse: (!) 50  Temp: 98 F (36.7 C)  TempSrc: Oral  SpO2: 97%  Weight: 116 lb 8 oz (52.8 kg)    Body mass index is 17.71 kg/m.   Constitutional: NAD, calm, comfortable Eyes: PERRL, lids and conjunctivae normal, wears corrective lenses ENMT: Mucous membranes are moist. Neurologic: Grossly intact and nonfocal Psychiatric: Normal judgment and insight. Alert and oriented x 3. Normal mood.    Impression and Plan:  Age-related osteoporosis without current pathological fracture   -We have discussed starting treatment with bisphosphonate therapy, we have discussed different dosing modalities including orals once a week or even Prolia which would be injected twice a year. -We have discussed potential of pill induced esophagitis and osteonecrosis of the jaw.  She will continue to follow-up with her dentist and inform them that she will be started on bisphosphonate therapy.  She knows not to lay down or sit for 30 to 60 minutes after taking oral bisphosphonates. -She has opted to start Fosamax 70 mg weekly.    Lelon Frohlich, MD  Primary Care at [facility]

## 2021-02-20 ENCOUNTER — Encounter: Payer: Self-pay | Admitting: Internal Medicine

## 2021-02-26 ENCOUNTER — Ambulatory Visit (HOSPITAL_COMMUNITY): Payer: Medicare Other | Attending: Cardiology

## 2021-02-26 ENCOUNTER — Other Ambulatory Visit: Payer: Self-pay

## 2021-02-26 DIAGNOSIS — R072 Precordial pain: Secondary | ICD-10-CM | POA: Insufficient documentation

## 2021-02-26 LAB — ECHOCARDIOGRAM COMPLETE
Area-P 1/2: 3.27 cm2
S' Lateral: 3 cm

## 2021-02-26 MED ORDER — PERFLUTREN LIPID MICROSPHERE
1.0000 mL | INTRAVENOUS | Status: AC | PRN
  Administered 2021-02-26: 1 mL via INTRAVENOUS

## 2021-03-17 ENCOUNTER — Other Ambulatory Visit: Payer: Self-pay

## 2021-03-17 ENCOUNTER — Ambulatory Visit (INDEPENDENT_AMBULATORY_CARE_PROVIDER_SITE_OTHER): Payer: Medicare Other | Admitting: Otolaryngology

## 2021-03-17 DIAGNOSIS — H61302 Acquired stenosis of left external ear canal, unspecified: Secondary | ICD-10-CM | POA: Diagnosis not present

## 2021-03-17 DIAGNOSIS — H6122 Impacted cerumen, left ear: Secondary | ICD-10-CM

## 2021-03-17 NOTE — Progress Notes (Signed)
HPI: Nicole Snow is a 70 y.o. female who presents for evaluation of blockage of her left ear.  She has a very small left ear canal compared to the right because of retrodisplacement of the left TMJ joint that was noted on temporal bone CT scan performed 7 months ago.  Past Medical History:  Diagnosis Date  . Allergy   . C. difficile colitis 08/2016  . HTN (hypertension)    Past Surgical History:  Procedure Laterality Date  . CHOLECYSTECTOMY    . COLONOSCOPY  02/27/2009   T A polyp  . HYSTEROSCOPY WITH D & C  1992  . LASIK Bilateral   . REPLACEMENT TOTAL HIP W/  RESURFACING IMPLANTS    . TENDON REPAIR Right 1982  . TENDON REPAIR     left hand ring finge at age 32 yr old  . TONSILLECTOMY    . WISDOM TOOTH EXTRACTION     Social History   Socioeconomic History  . Marital status: Married    Spouse name: Not on file  . Number of children: 1  . Years of education: Not on file  . Highest education level: Not on file  Occupational History  . Occupation: retired-realtor  Tobacco Use  . Smoking status: Never Smoker  . Smokeless tobacco: Never Used  Vaping Use  . Vaping Use: Never used  Substance and Sexual Activity  . Alcohol use: Yes    Alcohol/week: 7.0 standard drinks    Types: 7 Glasses of wine per week    Comment: beer/wine  . Drug use: Never  . Sexual activity: Not on file  Other Topics Concern  . Not on file  Social History Narrative  . Not on file   Social Determinants of Health   Financial Resource Strain: Not on file  Food Insecurity: Not on file  Transportation Needs: Not on file  Physical Activity: Not on file  Stress: Not on file  Social Connections: Not on file   Family History  Problem Relation Age of Onset  . Dementia Mother   . CVA Father 33  . COPD Father   . Colon cancer Neg Hx   . Rectal cancer Neg Hx   . Stomach cancer Neg Hx    Allergies  Allergen Reactions  . Ciprofloxacin Hives  . Clindamycin/Lincomycin Hives  . Flagyl  [Metronidazole] Hives  . Hydrocodone Hives  . Penicillins    Prior to Admission medications   Medication Sig Start Date End Date Taking? Authorizing Provider  alendronate (FOSAMAX) 70 MG tablet Take 1 tablet (70 mg total) by mouth every 7 (seven) days. Take with a full glass of water on an empty stomach. 01/30/21   Isaac Bliss, Rayford Halsted, MD  atorvastatin (LIPITOR) 10 MG tablet Take 1 tablet (10 mg total) by mouth daily. 01/15/21   Isaac Bliss, Rayford Halsted, MD  CALCIUM PO Take 500 mg by mouth daily.    [provider]  cetirizine (ZYRTEC) 10 MG tablet Take by mouth.    [provider]  Coenzyme Q10 (CO Q-10) 400 MG CAPS Take 1 capsule by mouth daily.    [provider]  cyanocobalamin 1000 MCG tablet Take 1,000 mcg by mouth daily.    [provider]  fluticasone (FLONASE) 50 MCG/ACT nasal spray Place 1 spray into both nostrils as needed.    [provider]  Ginkgo 60 MG TABS Take 1 tablet by mouth daily.    [provider]  hydrochlorothiazide (HYDRODIURIL) 25 MG tablet Take  1 tablet (25 mg total) by mouth daily. 01/15/21   Isaac Bliss, Rayford Halsted, MD  magnesium oxide (MAG-OX) 400 MG tablet Take 400 mg by mouth daily.    [provider]  meloxicam (MOBIC) 7.5 MG tablet Take 7.5 mg by mouth daily. 07/25/20   [provider]  Multiple Vitamins-Minerals (EMERGEN-C IMMUNE PO) Take 1 capsule by mouth daily.    [provider]  Probiotic Product (PROBIOTIC PO) Take 1 tablet by mouth daily.    [provider]  Turmeric (QC TUMERIC COMPLEX PO) Take by mouth.    [provider]     Positive ROS: Otherwise negative  All other systems have been reviewed and were otherwise negative with the exception of those mentioned in the HPI and as above.  Physical Exam: Constitutional: Alert, well-appearing, no acute distress Ears: External ears without lesions or tenderness.  Right ear canal is clear.  Left  ear canal is very stenotic and had to be opened up with a nasal speculum  in order to remove a minimal amount of wax buildup that was occluding the ear canal.  TM is clear. Nasal: External nose without lesions. Clear nasal passages Oral: Oropharynx clear. Neck: No palpable adenopathy or masses Respiratory: Breathing comfortably  Skin: No facial/neck lesions or rash noted.  Cerumen impaction removal  Date/Time: 03/17/2021 6:01 PM Performed by: Rozetta Nunnery, MD Authorized by: Rozetta Nunnery, MD   Consent:    Consent obtained:  Verbal   Consent given by:  Patient   Risks discussed:  Pain and bleeding Procedure details:    Location:  L ear and R ear   Procedure type: curette and forceps   Post-procedure details:    Inspection:  TM intact and canal normal   Hearing quality:  Improved   Patient tolerance of procedure:  Tolerated well, no immediate complications Comments:     Patient with a very stenotic left ear canal secondary to left TMJ disruption.  Ear canal was cleaned with forceps and curettes.    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 I discussed with her that she would probably benefit from having surgery at some point to enlarge the left ear canal. In the meantime she will follow-up in 6 months for recheck and cleaning. Radene Journey, MD

## 2021-03-26 ENCOUNTER — Other Ambulatory Visit: Payer: Self-pay

## 2021-03-26 ENCOUNTER — Encounter: Payer: Self-pay | Admitting: Internal Medicine

## 2021-03-26 ENCOUNTER — Ambulatory Visit (INDEPENDENT_AMBULATORY_CARE_PROVIDER_SITE_OTHER): Payer: Medicare Other | Admitting: Internal Medicine

## 2021-03-26 VITALS — BP 120/70 | HR 54 | Temp 97.4°F | Wt 118.1 lb

## 2021-03-26 DIAGNOSIS — I10 Essential (primary) hypertension: Secondary | ICD-10-CM

## 2021-03-26 DIAGNOSIS — M810 Age-related osteoporosis without current pathological fracture: Secondary | ICD-10-CM

## 2021-03-26 DIAGNOSIS — E785 Hyperlipidemia, unspecified: Secondary | ICD-10-CM | POA: Diagnosis not present

## 2021-03-26 MED ORDER — ALENDRONATE SODIUM 70 MG PO TABS
70.0000 mg | ORAL_TABLET | ORAL | 11 refills | Status: DC
Start: 2021-03-26 — End: 2022-01-29

## 2021-03-26 MED ORDER — ATORVASTATIN CALCIUM 10 MG PO TABS: 10.0000 mg | ORAL_TABLET | Freq: Every day | ORAL | 1 refills | Status: DC

## 2021-03-26 MED ORDER — HYDROCHLOROTHIAZIDE 25 MG PO TABS: 25.0000 mg | ORAL_TABLET | Freq: Every day | ORAL | 1 refills | Status: DC

## 2021-03-26 NOTE — Progress Notes (Signed)
Established Patient Office Visit     This visit occurred during the SARS-CoV-2 public health emergency.  Safety protocols were in place, including screening questions prior to the visit, additional usage of staff PPE, and extensive cleaning of exam room while observing appropriate contact time as indicated for disinfecting solutions.    CC/Reason for Visit: Follow-up blood pressure  HPI: Nicole Snow is a 70 y.o. female who is coming in today for the above mentioned reasons. Past Medical History is significant for: Recently diagnosed hypertension, hyperlipidemia and osteoporosis.  She is taking hydrochlorothiazide 25 mg every other day as she feels she sometimes gets dizzy while on it.  Systolics are now around the 110 range.  Other than this she has no complaints.   Past Medical/Surgical History: Past Medical History:  Diagnosis Date  . Allergy   . C. difficile colitis 08/2016  . HTN (hypertension)     Past Surgical History:  Procedure Laterality Date  . CHOLECYSTECTOMY    . COLONOSCOPY  02/27/2009   T A polyp  . HYSTEROSCOPY WITH D & C  1992  . LASIK Bilateral   . REPLACEMENT TOTAL HIP W/  RESURFACING IMPLANTS    . TENDON REPAIR Right 1982  . TENDON REPAIR     left hand ring finge at age 3 yr old  . TONSILLECTOMY    . WISDOM TOOTH EXTRACTION      Social History:  reports that she has never smoked. She has never used smokeless tobacco. She reports current alcohol use of about 7.0 standard drinks of alcohol per week. She reports that she does not use drugs.  Allergies: Allergies  Allergen Reactions  . Ciprofloxacin Hives  . Clindamycin/Lincomycin Hives  . Flagyl [Metronidazole] Hives  . Hydrocodone Hives  . Penicillins     Family History:  Family History  Problem Relation Age of Onset  . Dementia Mother   . CVA Father 75  . COPD Father   . Colon cancer Neg Hx   . Rectal cancer Neg Hx   . Stomach cancer Neg Hx      Current Outpatient  Medications:  .  CALCIUM PO, Take 500 mg by mouth daily., Disp: , Rfl:  .  cetirizine (ZYRTEC) 10 MG tablet, Take by mouth., Disp: , Rfl:  .  Coenzyme Q10 (CO Q-10) 400 MG CAPS, Take 1 capsule by mouth daily., Disp: , Rfl:  .  cyanocobalamin 1000 MCG tablet, Take 1,000 mcg by mouth daily., Disp: , Rfl:  .  fluticasone (FLONASE) 50 MCG/ACT nasal spray, Place 1 spray into both nostrils as needed., Disp: , Rfl:  .  Ginkgo 60 MG TABS, Take 1 tablet by mouth daily., Disp: , Rfl:  .  magnesium oxide (MAG-OX) 400 MG tablet, Take 400 mg by mouth daily., Disp: , Rfl:  .  meloxicam (MOBIC) 7.5 MG tablet, Take 7.5 mg by mouth daily., Disp: , Rfl:  .  Multiple Vitamins-Minerals (EMERGEN-C IMMUNE PO), Take 1 capsule by mouth daily., Disp: , Rfl:  .  Probiotic Product (PROBIOTIC PO), Take 1 tablet by mouth daily., Disp: , Rfl:  .  Turmeric (QC TUMERIC COMPLEX PO), Take by mouth., Disp: , Rfl:  .  alendronate (FOSAMAX) 70 MG tablet, Take 1 tablet (70 mg total) by mouth every 7 (seven) days. Take with a full glass of water on an empty stomach., Disp: 4 tablet, Rfl: 11 .  atorvastatin (LIPITOR) 10 MG tablet, Take 1 tablet (10 mg total) by mouth daily.,  Disp: 90 tablet, Rfl: 1 .  hydrochlorothiazide (HYDRODIURIL) 25 MG tablet, Take 1 tablet (25 mg total) by mouth daily., Disp: 90 tablet, Rfl: 1  Review of Systems:  Constitutional: Denies fever, chills, diaphoresis, appetite change and fatigue.  HEENT: Denies photophobia, eye pain, redness, hearing loss, ear pain, congestion, sore throat, rhinorrhea, sneezing, mouth sores, trouble swallowing, neck pain, neck stiffness and tinnitus.   Respiratory: Denies SOB, DOE, cough, chest tightness,  and wheezing.   Cardiovascular: Denies chest pain, palpitations and leg swelling.  Gastrointestinal: Denies nausea, vomiting, abdominal pain, diarrhea, constipation, blood in stool and abdominal distention.  Genitourinary: Denies dysuria, urgency, frequency, hematuria, flank pain  and difficulty urinating.  Endocrine: Denies: hot or cold intolerance, sweats, changes in hair or nails, polyuria, polydipsia. Musculoskeletal: Denies myalgias, back pain, joint swelling, arthralgias and gait problem.  Skin: Denies pallor, rash and wound.  Neurological: Denies dizziness, seizures, syncope, weakness, light-headedness, numbness and headaches.  Hematological: Denies adenopathy. Easy bruising, personal or family bleeding history  Psychiatric/Behavioral: Denies suicidal ideation, mood changes, confusion, nervousness, sleep disturbance and agitation    Physical Exam: Vitals:   03/26/21 0758  BP: 120/70  Pulse: (!) 54  Temp: (!) 97.4 F (36.3 C)  TempSrc: Oral  SpO2: 97%  Weight: 118 lb 1.6 oz (53.6 kg)    Body mass index is 17.96 kg/m.   Constitutional: NAD, calm, comfortable Eyes: PERRL, lids and conjunctivae normal, wears corrective lenses ENMT: Mucous membranes are moist.  Respiratory: clear to auscultation bilaterally, no wheezing, no crackles. Normal respiratory effort. No accessory muscle use.  Cardiovascular: Regular rate and rhythm, no murmurs / rubs / gallops. No extremity edema.  Neurologic: Grossly intact and nonfocal Psychiatric: Normal judgment and insight. Alert and oriented x 3. Normal mood.    Impression and Plan:  Primary hypertension  - Plan: hydrochlorothiazide (HYDRODIURIL) 25 MG tablet -Well-controlled and in office blood pressure 120/70.  Age-related osteoporosis without current pathological fracture  - Plan: alendronate (FOSAMAX) 70 MG tablet  Hyperlipidemia, unspecified hyperlipidemia type  - Plan: atorvastatin (LIPITOR) 10 MG tablet   Truda Staub Isaac Bliss, MD [facility]

## 2021-03-27 ENCOUNTER — Encounter: Payer: Self-pay | Admitting: Internal Medicine

## 2021-03-28 ENCOUNTER — Encounter (HOSPITAL_BASED_OUTPATIENT_CLINIC_OR_DEPARTMENT_OTHER): Payer: Self-pay | Admitting: Emergency Medicine

## 2021-03-28 ENCOUNTER — Emergency Department (HOSPITAL_BASED_OUTPATIENT_CLINIC_OR_DEPARTMENT_OTHER)
Admission: EM | Admit: 2021-03-28 | Discharge: 2021-03-28 | Disposition: A | Discharge status: Home / Self Care | Payer: Medicare Other | Attending: Emergency Medicine | Admitting: Emergency Medicine

## 2021-03-28 ENCOUNTER — Other Ambulatory Visit: Payer: Self-pay

## 2021-03-28 DIAGNOSIS — Z79899 Other long term (current) drug therapy: Secondary | ICD-10-CM | POA: Diagnosis not present

## 2021-03-28 DIAGNOSIS — Z96649 Presence of unspecified artificial hip joint: Secondary | ICD-10-CM | POA: Diagnosis not present

## 2021-03-28 DIAGNOSIS — I10 Essential (primary) hypertension: Secondary | ICD-10-CM | POA: Diagnosis present

## 2021-03-28 DIAGNOSIS — R251 Tremor, unspecified: Secondary | ICD-10-CM | POA: Diagnosis not present

## 2021-03-28 LAB — CBC WITH DIFFERENTIAL/PLATELET
Abs Immature Granulocytes: 0.01 10*3/uL (ref 0.00–0.07)
Basophils Absolute: 0 10*3/uL (ref 0.0–0.1)
Basophils Relative: 1 %
Eosinophils Absolute: 0.1 10*3/uL (ref 0.0–0.5)
Eosinophils Relative: 1 %
HCT: 42.5 % (ref 36.0–46.0)
Hemoglobin: 14.7 g/dL (ref 12.0–15.0)
Immature Granulocytes: 0 %
Lymphocytes Relative: 19 %
Lymphs Abs: 0.9 10*3/uL (ref 0.7–4.0)
MCH: 32.3 pg (ref 26.0–34.0)
MCHC: 34.6 g/dL (ref 30.0–36.0)
MCV: 93.4 fL (ref 80.0–100.0)
Monocytes Absolute: 0.3 10*3/uL (ref 0.1–1.0)
Monocytes Relative: 6 %
Neutro Abs: 3.4 10*3/uL (ref 1.7–7.7)
Neutrophils Relative %: 73 %
Platelets: 139 10*3/uL — ABNORMAL LOW (ref 150–400)
RBC: 4.55 MIL/uL (ref 3.87–5.11)
RDW: 12.6 % (ref 11.5–15.5)
WBC: 4.6 10*3/uL (ref 4.0–10.5)
nRBC: 0 % (ref 0.0–0.2)

## 2021-03-28 LAB — MAGNESIUM: Magnesium: 1.9 mg/dL (ref 1.7–2.4)

## 2021-03-28 LAB — COMPREHENSIVE METABOLIC PANEL
ALT: 25 U/L (ref 0–44)
AST: 29 U/L (ref 15–41)
Albumin: 4.1 g/dL (ref 3.5–5.0)
Alkaline Phosphatase: 69 U/L (ref 38–126)
Anion gap: 8 (ref 5–15)
BUN: 13 mg/dL (ref 8–23)
CO2: 28 mmol/L (ref 22–32)
Calcium: 9.2 mg/dL (ref 8.9–10.3)
Chloride: 101 mmol/L (ref 98–111)
Creatinine, Ser: 0.72 mg/dL (ref 0.44–1.00)
GFR, Estimated: >60 mL/min (ref >60)
Glucose, Bld: 145 mg/dL — ABNORMAL HIGH (ref 70–99)
Potassium: 3.8 mmol/L (ref 3.5–5.1)
Sodium: 137 mmol/L (ref 135–145)
Total Bilirubin: 1.3 mg/dL — ABNORMAL HIGH (ref 0.3–1.2)
Total Protein: 6.2 g/dL — ABNORMAL LOW (ref 6.5–8.1)

## 2021-03-28 LAB — TSH: TSH: 1.125 u[IU]/mL (ref 0.350–4.500)

## 2021-03-28 LAB — CK: Total CK: 126 U/L (ref 38–234)

## 2021-03-28 MED ORDER — HYDRALAZINE HCL 25 MG PO TABS: 25.0000 mg | ORAL_TABLET | Freq: Two times a day (BID) | ORAL | 0 refills | Status: DC | PRN

## 2021-03-28 NOTE — ED Triage Notes (Signed)
Pt reports feeling shaky and weak this am. Also had one bout of diarrhea. Pt has recently started blood pressure medication (7 weeks ago). Pt took her bp and was in 220's. Her primary doctor directed her to ED. Pt still feeling weak

## 2021-03-28 NOTE — ED Provider Notes (Signed)
[facility] Provider Note   CSN: [id number] Arrival date & time: 03/28/21  5701     History Chief Complaint  Patient presents with  . Hypertension    Nicole Snow is a 70 y.o. female.  HPI    70 year old female comes in with chief complaint of elevated blood pressure. She has history of hypertension, C. difficile colitis.  Patient reports that she was started on hydrochlorothiazide about 7 weeks ago for new diagnosis of elevated blood pressure.  Since then they have been periodically and regularly checking their BP at the recommendation of their primary care physician.  Today, they noted that the blood pressure was significantly elevated over 779 systolic.  Patient denies any associated chest pain, shortness of breath, new headaches, focal neurodeficits, vision change.  Additionally, patient reports that she woke up feeling weaker than usual.  She is also noticing that her legs are having some tremors.  She had a large diarrhea type BM earlier today and has had intermittent diarrhea this week.  Patient has taken Imodium for her diarrhea in the recent past.  Patient denies any new medications besides as needed Imodium use.  No history of heavy drinking, smoking or substance use.  Past Medical History:  Diagnosis Date  . Allergy   . C. difficile colitis 08/2016  . HTN (hypertension)     Patient Active Problem List   Diagnosis Date Noted  . Osteoporosis 01/28/2021  . Hypertension 01/15/2021  . Hyperlipidemia 01/15/2021  . Unilateral primary osteoarthritis, left hip 08/05/2020  . Tendinopathy of left gluteus medius 04/30/2020  . Impacted cerumen of left ear 02/02/2017  . Sensation of fullness in left ear 02/02/2017  . Sensorineural hearing loss (SNHL) of left ear with unrestricted hearing of right ear 02/02/2017    Past Surgical History:  Procedure Laterality Date  . CHOLECYSTECTOMY    . COLONOSCOPY  02/27/2009   T A polyp  .  HYSTEROSCOPY WITH D & C  1992  . LASIK Bilateral   . REPLACEMENT TOTAL HIP W/  RESURFACING IMPLANTS    . TENDON REPAIR Right 1982  . TENDON REPAIR     left hand ring finge at age 26 yr old  . TONSILLECTOMY    . WISDOM TOOTH EXTRACTION       OB History   No obstetric history on file.     Family History  Problem Relation Age of Onset  . Dementia Mother   . CVA Father 93  . COPD Father   . Colon cancer Neg Hx   . Rectal cancer Neg Hx   . Stomach cancer Neg Hx     Social History   Tobacco Use  . Smoking status: Never Smoker  . Smokeless tobacco: Never Used  Vaping Use  . Vaping Use: Never used  Substance Use Topics  . Alcohol use: Yes    Alcohol/week: 7.0 standard drinks    Types: 7 Glasses of wine per week    Comment: beer/wine  . Drug use: Never    Home Medications Prior to Admission medications   Medication Sig Start Date End Date Taking? Authorizing Provider  alendronate (FOSAMAX) 70 MG tablet Take 1 tablet (70 mg total) by mouth every 7 (seven) days. Take with a full glass of water on an empty stomach. 03/26/21  Yes Isaac Bliss, Rayford Halsted, MD  atorvastatin (LIPITOR) 10 MG tablet Take 1 tablet (10 mg total) by mouth daily. 03/26/21  Yes Erline Hau, MD  CALCIUM  PO Take 500 mg by mouth daily.   Yes [provider]  cetirizine (ZYRTEC) 10 MG tablet Take by mouth.   Yes [provider]  Coenzyme Q10 (CO Q-10) 400 MG CAPS Take 1 capsule by mouth daily.   Yes [provider]  cyanocobalamin 1000 MCG tablet Take 1,000 mcg by mouth daily.   Yes [provider]  fluticasone (FLONASE) 50 MCG/ACT nasal spray Place 1 spray into both nostrils as needed.   Yes [provider]  Ginkgo 60 MG TABS Take 1 tablet by mouth daily.   Yes [provider]  hydrALAZINE (APRESOLINE) 25 MG tablet Take 1 tablet (25 mg total) by mouth 2 (two) times daily as needed. 03/28/21  Yes Varney Biles, MD  hydrochlorothiazide  (HYDRODIURIL) 25 MG tablet Take 1 tablet (25 mg total) by mouth daily. 03/26/21  Yes Isaac Bliss, Rayford Halsted, MD  magnesium oxide (MAG-OX) 400 MG tablet Take 400 mg by mouth daily.   Yes [provider]  Multiple Vitamins-Minerals (EMERGEN-C IMMUNE PO) Take 1 capsule by mouth daily.   Yes [provider]  Probiotic Product (PROBIOTIC PO) Take 1 tablet by mouth daily.   Yes [provider]  Turmeric (QC TUMERIC COMPLEX PO) Take by mouth.   Yes [provider]  meloxicam (MOBIC) 7.5 MG tablet Take 7.5 mg by mouth daily. 07/25/20   [provider]    Allergies    Ciprofloxacin, Clindamycin/lincomycin, Flagyl [metronidazole], Hydrocodone, and Penicillins  Review of Systems   Review of Systems  Constitutional: Positive for activity change.  Respiratory: Negative for shortness of breath.   Cardiovascular: Negative for chest pain.  Gastrointestinal: Positive for diarrhea. Negative for nausea and vomiting.  Neurological: Positive for dizziness. Negative for syncope, speech difficulty, weakness, light-headedness, numbness and headaches.  All other systems reviewed and are negative.   Physical Exam Updated Vital Signs BP (!) 154/88   Pulse 64   Temp 97.6 F (36.4 C) (Oral)   Resp 16   Ht 5\' 8"  (1.727 m)   Wt 52.2 kg   LMP  (LMP Unknown)   SpO2 100%   BMI 17.49 kg/m   Physical Exam  ED Results / Procedures / Treatments   Labs (all labs ordered are listed, but only abnormal results are displayed) Labs Reviewed  COMPREHENSIVE METABOLIC PANEL - Abnormal; Notable for the following components:      Result Value   Glucose, Bld 145 (*)    Total Protein 6.2 (*)    Total Bilirubin 1.3 (*)    All other components within normal limits  CBC WITH DIFFERENTIAL/PLATELET - Abnormal; Notable for the following components:   Platelets 139 (*)    All other components within normal limits  MAGNESIUM  CK  TSH    EKG EKG  Interpretation  Date/Time:  Friday March 28 2021 10:24:39 EDT Ventricular Rate:  60 PR Interval:  136 QRS Duration: 87 QT Interval:  435 QTC Calculation: 435 R Axis:   77 Text Interpretation: Sinus rhythm RSR' in V1 or V2, probably normal variant No acute changes No old tracing to compare Confirmed by Varney Biles [phone]) on 03/28/2021 10:50:21 AM   Radiology No results found.  Procedures Procedures   Medications Ordered in ED Medications - No data to display  ED Course  I have reviewed the triage vital signs and the nursing notes.  Pertinent labs & imaging results that were available during my care of the patient were reviewed by me and considered in  my medical decision making (see chart for details).  Clinical Course as of 03/28/21 1201  Fri Mar 28, 2021  1159 BP(!): 154/88 Patient's BP is come down gradually on its own.  Lab results are reassuring.  Last blood pressure check is 140s over 80s.  TSH is pending, but it is a send out.  UA canceled as patient is not having any urinary symptoms and the blood work is reassuring.  Results discussed with the patient.  Clinically, not sure why her BP was high today.  It did not appear anxiety driven.  Discussed with her the need for continued BP log and taking hydralazine only if her BP is > 180/120 for 2 separate readings family support in a controlled, calm environment. [AN]  1201 The patient appears reasonably screened and/or stabilized for discharge and I doubt any other medical condition or other [facility] requiring further screening, evaluation, or treatment in the ED at this time prior to discharge.  Results from the ER workup discussed with the patient face to face and all questions answered to the best of my ability. The patient is safe for discharge with strict return precautions.    [AN]    Clinical Course User Index [AN] Varney Biles, MD   MDM Rules/Calculators/A&P                          70 year old comes in a  chief complaint of elevated blood pressure.  With the elevated blood pressure, there is no associated neurologic or cardiovascular symptoms.  We will get an EKG along with basic labs to ensure there is no endorgan damage.  It is not clear why her pressure is elevated.  She has been routinely checking her blood pressure and they have always been in the 130s or 120s.  Her BP must have been well controlled as she is only on hydrochlorothiazide.  To ensure that this was not secondary to any acute condition like a stroke, we did a full neuro exam, and patient does not have any focal neurodeficits or ataxia.  I did notice that she is having some twitching in her thighs -this could be a result of some electrolyte disturbance especially since she has had some diarrhea.  Basic labs and CK pending.  At this time, I do not think she has acute hyperthyroidism or toxin mediated process.  Our plan is to monitor the patient's blood pressure closely and keep her on the telemetry monitoring, check basic labs.  If her work-up is negative, then we should be able to discharge her with as needed hydralazine for BP over 180/120, and have her follow-up with PCP for optimization.  We would advise her to continue checking her blood pressure regularly and to enter in the log to help the PCP.  Final Clinical Impression(s) / ED Diagnoses Final diagnoses:  Primary hypertension    Rx / DC Orders ED Discharge Orders         Ordered    hydrALAZINE (APRESOLINE) 25 MG tablet  2 times daily PRN        03/28/21 1156           Varney Biles, MD 03/28/21 1201

## 2021-04-01 ENCOUNTER — Other Ambulatory Visit: Payer: Self-pay

## 2021-04-02 ENCOUNTER — Ambulatory Visit (INDEPENDENT_AMBULATORY_CARE_PROVIDER_SITE_OTHER): Payer: Medicare Other | Admitting: Internal Medicine

## 2021-04-02 ENCOUNTER — Encounter: Payer: Self-pay | Admitting: Internal Medicine

## 2021-04-02 VITALS — BP 140/70 | HR 64 | Temp 98.0°F | Ht 68.0 in | Wt 115.6 lb

## 2021-04-02 DIAGNOSIS — Z09 Encounter for follow-up examination after completed treatment for conditions other than malignant neoplasm: Secondary | ICD-10-CM

## 2021-04-02 DIAGNOSIS — I10 Essential (primary) hypertension: Secondary | ICD-10-CM

## 2021-04-02 NOTE — Progress Notes (Signed)
Established Patient Office Visit     This visit occurred during the SARS-CoV-2 public health emergency.  Safety protocols were in place, including screening questions prior to the visit, additional usage of staff PPE, and extensive cleaning of exam room while observing appropriate contact time as indicated for disinfecting solutions.    CC/Reason for Visit: Emergency room follow-up.  HPI: Nicole Snow is a 70 y.o. female who is coming in today for the above mentioned reasons.  I saw her recently in clinic on 6/1.  Her in office blood pressure today was 120/70.  She had brought in her ambulatory measurements with systolics in the 062-3 10 range.  At that point she had been taking hydrochlorothiazide 25 mg every other day because when she was taking it daily her blood pressures would dip into the 90 range and she would get dizzy.  The day of her emergency room visit she had several systolic blood pressures above 200.  In addition to that she had a headache, what felt like a sinus infection and diarrhea.  After thorough evaluation in the emergency department with relatively normal work-up she was discharged home.  She was given hydralazine to take as needed.  She comes in today for follow-up.  In office blood pressure is 140/70.  Since her ED visit she has been taking her blood pressure daily.  She tells me that in addition to headache and some sinus congestion she has also been having some chills, fatigue and thigh pain.  She took a home COVID test negative that was yesterday.  Past Medical/Surgical History: Past Medical History:  Diagnosis Date  . Allergy   . C. difficile colitis 08/2016  . HTN (hypertension)     Past Surgical History:  Procedure Laterality Date  . CHOLECYSTECTOMY    . COLONOSCOPY  02/27/2009   T A polyp  . HYSTEROSCOPY WITH D & C  1992  . LASIK Bilateral   . REPLACEMENT TOTAL HIP W/  RESURFACING IMPLANTS    . TENDON REPAIR Right 1982  . TENDON REPAIR      left hand ring finge at age 28 yr old  . TONSILLECTOMY    . WISDOM TOOTH EXTRACTION      Social History:  reports that she has never smoked. She has never used smokeless tobacco. She reports current alcohol use of about 7.0 standard drinks of alcohol per week. She reports that she does not use drugs.  Allergies: Allergies  Allergen Reactions  . Ciprofloxacin Hives  . Clindamycin/Lincomycin Hives  . Flagyl [Metronidazole] Hives  . Hydrocodone Hives  . Penicillins     Family History:  Family History  Problem Relation Age of Onset  . Dementia Mother   . CVA Father 23  . COPD Father   . Colon cancer Neg Hx   . Rectal cancer Neg Hx   . Stomach cancer Neg Hx      Current Outpatient Medications:  .  alendronate (FOSAMAX) 70 MG tablet, Take 1 tablet (70 mg total) by mouth every 7 (seven) days. Take with a full glass of water on an empty stomach., Disp: 4 tablet, Rfl: 11 .  atorvastatin (LIPITOR) 10 MG tablet, Take 1 tablet (10 mg total) by mouth daily., Disp: 90 tablet, Rfl: 1 .  CALCIUM PO, Take 500 mg by mouth daily., Disp: , Rfl:  .  cetirizine (ZYRTEC) 10 MG tablet, Take by mouth., Disp: , Rfl:  .  Coenzyme Q10 (CO Q-10) 400 MG CAPS,  Take 1 capsule by mouth daily., Disp: , Rfl:  .  cyanocobalamin 1000 MCG tablet, Take 1,000 mcg by mouth daily., Disp: , Rfl:  .  fluticasone (FLONASE) 50 MCG/ACT nasal spray, Place 1 spray into both nostrils as needed., Disp: , Rfl:  .  Ginkgo 60 MG TABS, Take 1 tablet by mouth daily., Disp: , Rfl:  .  hydrALAZINE (APRESOLINE) 25 MG tablet, Take 1 tablet (25 mg total) by mouth 2 (two) times daily as needed., Disp: 10 tablet, Rfl: 0 .  hydrochlorothiazide (HYDRODIURIL) 25 MG tablet, Take 1 tablet (25 mg total) by mouth daily., Disp: 90 tablet, Rfl: 1 .  magnesium oxide (MAG-OX) 400 MG tablet, Take 400 mg by mouth daily., Disp: , Rfl:  .  meloxicam (MOBIC) 7.5 MG tablet, Take 7.5 mg by mouth daily., Disp: , Rfl:  .  Multiple Vitamins-Minerals  (EMERGEN-C IMMUNE PO), Take 1 capsule by mouth daily., Disp: , Rfl:  .  Probiotic Product (PROBIOTIC PO), Take 1 tablet by mouth daily., Disp: , Rfl:  .  Turmeric (QC TUMERIC COMPLEX PO), Take by mouth., Disp: , Rfl:   Review of Systems:  Constitutional: Denies fever, diaphoresis, appetite change. HEENT: Denies photophobia, eye pain, redness, hearing loss,  mouth sores, trouble swallowing, neck pain, neck stiffness and tinnitus.   Respiratory: Denies SOB, DOE, cough, chest tightness,  and wheezing.   Cardiovascular: Denies chest pain, palpitations and leg swelling.  Gastrointestinal: Denies nausea, vomiting, abdominal pain, constipation, blood in stool and abdominal distention.  Genitourinary: Denies dysuria, urgency, frequency, hematuria, flank pain and difficulty urinating.  Endocrine: Denies: hot or cold intolerance, sweats, changes in hair or nails, polyuria, polydipsia. Musculoskeletal: Denies myalgias, back pain, joint swelling, arthralgias and gait problem.  Skin: Denies pallor, rash and wound.  Neurological: Denies dizziness, seizures, syncope, weakness, light-headedness, numbness and headaches.  Hematological: Denies adenopathy. Easy bruising, personal or family bleeding history  Psychiatric/Behavioral: Denies suicidal ideation, mood changes, confusion, nervousness, sleep disturbance and agitation    Physical Exam: Vitals:   04/02/21 1525  BP: 140/70  Pulse: 64  Temp: 98 F (36.7 C)  TempSrc: Oral  SpO2: 98%  Weight: 115 lb 9.6 oz (52.4 kg)  Height: 5\' 8"  (1.727 m)    Body mass index is 17.58 kg/m.   Constitutional: NAD, calm, comfortable Eyes: PERRL, lids and conjunctivae normal, wears corrective lenses ENMT: Mucous membranes are moist. Neurologic: Grossly intact and nonfocal Psychiatric: Normal judgment and insight. Alert and oriented x 3. Normal mood.    Impression and Plan:  Hospital discharge follow-up  Primary hypertension  -Hospital charts have been  reviewed in detail. -I suspect she may have an upper respiratory virus, possibly COVID that could be causing transient elevation in blood pressure. -Given well-controlled blood pressure just last week, I do not believe that increasing her antihypertensive therapy is the correct answer. -She will retest for COVID tomorrow and notify me if positive. -Have advised continue ambulatory monitoring and contact me with any issues.  Have advised ED evaluation only if systolic blood pressure above 200 in addition to another symptom like headache, chest pain, shortness of breath or focal neurologic deficit.     Lelon Frohlich, MD [facility]

## 2021-04-16 ENCOUNTER — Encounter: Payer: Self-pay | Admitting: Internal Medicine

## 2021-04-17 ENCOUNTER — Encounter: Payer: Self-pay | Admitting: Internal Medicine

## 2021-04-18 ENCOUNTER — Encounter: Payer: Self-pay | Admitting: Internal Medicine

## 2021-04-18 NOTE — Telephone Encounter (Signed)
Patient is scheduled with Dr. Elease Hashimoto on 06/27

## 2021-04-21 ENCOUNTER — Other Ambulatory Visit: Payer: Self-pay

## 2021-04-21 ENCOUNTER — Encounter: Payer: Self-pay | Admitting: Family Medicine

## 2021-04-21 ENCOUNTER — Ambulatory Visit (INDEPENDENT_AMBULATORY_CARE_PROVIDER_SITE_OTHER): Payer: Medicare Other | Admitting: Family Medicine

## 2021-04-21 VITALS — BP 130/80 | HR 72 | Temp 97.5°F | Wt 112.6 lb

## 2021-04-21 DIAGNOSIS — I10 Essential (primary) hypertension: Secondary | ICD-10-CM | POA: Diagnosis not present

## 2021-04-21 LAB — BASIC METABOLIC PANEL
BUN: 11 mg/dL (ref 6–23)
CO2: 28 mEq/L (ref 19–32)
Calcium: 10.3 mg/dL (ref 8.4–10.5)
Chloride: 100 mEq/L (ref 96–112)
Creatinine, Ser: 0.76 mg/dL (ref 0.40–1.20)
GFR: 80.43 mL/min (ref >60.00)
Glucose, Bld: 87 mg/dL (ref 70–99)
Potassium: 3.7 mEq/L (ref 3.5–5.1)
Sodium: 136 mEq/L (ref 135–145)

## 2021-04-21 NOTE — Progress Notes (Signed)
Established Patient Office Visit  Subjective:  Patient ID: Nicole Snow, female    DOB: 04-22-1951  Age: 70 y.o. MRN: [id number]  CC:  Chief Complaint  Patient presents with   Hypertension    Avg BP at home before meds is 222/70, BP fluctuates during th day, pt complains of feeling light headed.     HPI [place] presents for recent elevated blood pressure readings.  She states that she was placed on HCTZ several months ago for hypertension and she takes that regularly.  She went in for some dental work June 1 and then on the third she had very high blood pressure reading which prompted ER visit.  She had a systolic reading over 767.  She had several labs that were unremarkable and those were all reviewed.  EKG unremarkable.  She was given hydralazine 25 mg to take 1 twice daily as needed for blood pressure over 341 systolic.  Patient has had sporadic readings above 200 since then.  For example, last Wednesday she had reading of 224/86 and after taking hydralazine this came down to 154 and eventually later in the day to 108.  She had several days since then with sporadic readings up over 200 but then back to normal.  She had a lot of nonspecific symptoms recently including some chills without fever and intermittent headaches and some malaise and dizziness.  She has had elevated readings in several different locations and also her husband has taken his blood pressure several times and even when she has gotten very high readings his readings have generally been normal with their cuff.  Also, they state they brought her cuff in to compare with ours recently and had very similar readings  Recent ER readings included TSH, creatinine kinase, magnesium level, CBC, CMP and these were all basically normal.  She also had echocardiogram back in May with no major valvular issues.  No evidence for coarctation of aorta  She does drink some wine generally 1 to 1-1/2 glasses/day but  has had none in the past 5 days.  No nonsteroidal use recently   Past Medical History:  Diagnosis Date   Allergy    C. difficile colitis 08/2016   HTN (hypertension)     Past Surgical History:  Procedure Laterality Date   CHOLECYSTECTOMY     COLONOSCOPY  02/27/2009   T A polyp   HYSTEROSCOPY WITH D & C  1992   LASIK Bilateral    REPLACEMENT TOTAL HIP W/  RESURFACING IMPLANTS     TENDON REPAIR Right 1982   TENDON REPAIR     left hand ring finge at age 70 yr old   TONSILLECTOMY     WISDOM TOOTH EXTRACTION      Family History  Problem Relation Age of Onset   Dementia Mother    CVA Father 72   COPD Father    Colon cancer Neg Hx    Rectal cancer Neg Hx    Stomach cancer Neg Hx     Social History   Socioeconomic History   Marital status: Married    Spouse name: Not on file   Number of children: 1   Years of education: Not on file   Highest education level: Not on file  Occupational History   Occupation: retired-realtor  Tobacco Use   Smoking status: Never   Smokeless tobacco: Never  Vaping Use   Vaping Use: Never used  Substance and Sexual Activity   Alcohol use: Yes  Alcohol/week: 7.0 standard drinks    Types: 7 Glasses of wine per week    Comment: beer/wine   Drug use: Never   Sexual activity: Not on file  Other Topics Concern   Not on file  Social History Narrative   Not on file   Social Determinants of Health   Financial Resource Strain: Not on file  Food Insecurity: Not on file  Transportation Needs: Not on file  Physical Activity: Not on file  Stress: Not on file  Social Connections: Not on file  Intimate Partner Violence: Not on file    Outpatient Medications Prior to Visit  Medication Sig Dispense Refill   alendronate (FOSAMAX) 70 MG tablet Take 1 tablet (70 mg total) by mouth every 7 (seven) days. Take with a full glass of water on an empty stomach. 4 tablet 11   atorvastatin (LIPITOR) 10 MG tablet Take 1 tablet (10 mg total) by mouth  daily. 90 tablet 1   CALCIUM PO Take 500 mg by mouth daily.     cetirizine (ZYRTEC) 10 MG tablet Take by mouth.     Coenzyme Q10 (CO Q-10) 400 MG CAPS Take 1 capsule by mouth daily.     cyanocobalamin 1000 MCG tablet Take 1,000 mcg by mouth daily.     fluticasone (FLONASE) 50 MCG/ACT nasal spray Place 1 spray into both nostrils as needed.     Ginkgo 60 MG TABS Take 1 tablet by mouth daily.     hydrALAZINE (APRESOLINE) 25 MG tablet Take 1 tablet (25 mg total) by mouth 2 (two) times daily as needed. 10 tablet 0   hydrochlorothiazide (HYDRODIURIL) 25 MG tablet Take 1 tablet (25 mg total) by mouth daily. 90 tablet 1   magnesium oxide (MAG-OX) 400 MG tablet Take 400 mg by mouth daily.     Multiple Vitamins-Minerals (EMERGEN-C IMMUNE PO) Take 1 capsule by mouth daily.     Probiotic Product (PROBIOTIC PO) Take 1 tablet by mouth daily.     Turmeric (QC TUMERIC COMPLEX PO) Take by mouth.     meloxicam (MOBIC) 7.5 MG tablet Take 7.5 mg by mouth daily.     No facility-administered medications prior to visit.    Allergies  Allergen Reactions   Ciprofloxacin Hives   Clindamycin/Lincomycin Hives   Flagyl [Metronidazole] Hives   Hydrocodone Hives   Penicillins     ROS Review of Systems  Constitutional:  Positive for chills. Negative for fever.  Eyes:  Negative for visual disturbance.  Respiratory:  Negative for cough, chest tightness, shortness of breath and wheezing.   Cardiovascular:  Negative for chest pain, palpitations and leg swelling.  Neurological:  Positive for dizziness and headaches. Negative for seizures, syncope, weakness and light-headedness.     Objective:    Physical Exam Vitals reviewed.  Constitutional:      Appearance: Normal appearance.  Cardiovascular:     Rate and Rhythm: Normal rate and regular rhythm.  Pulmonary:     Effort: Pulmonary effort is normal.     Breath sounds: Normal breath sounds.  Abdominal:     Comments: No abdominal bruits.  Musculoskeletal:      Cervical back: Neck supple.     Right lower leg: No edema.     Left lower leg: No edema.  Lymphadenopathy:     Cervical: No cervical adenopathy.  Neurological:     Mental Status: She is alert.    BP 130/80 (BP Location: Left Arm, Patient Position: Sitting, Cuff Size: Normal)   Pulse  72   Temp (!) 97.5 F (36.4 C) (Oral)   Wt 112 lb 9.6 oz (51.1 kg)   LMP  (LMP Unknown)   SpO2 100%   BMI 17.12 kg/m  Wt Readings from Last 3 Encounters:  04/21/21 112 lb 9.6 oz (51.1 kg)  04/02/21 115 lb 9.6 oz (52.4 kg)  03/28/21 115 lb (52.2 kg)     Health Maintenance Due  Topic Date Due   Hepatitis C Screening  Never done    There are no preventive care reminders to display for this patient.  Lab Results  Component Value Date   TSH 1.125 03/28/2021   Lab Results  Component Value Date   WBC 4.6 03/28/2021   HGB 14.7 03/28/2021   HCT 42.5 03/28/2021   MCV 93.4 03/28/2021   PLT 139 (L) 03/28/2021   Lab Results  Component Value Date   NA 137 03/28/2021   K 3.8 03/28/2021   CO2 28 03/28/2021   GLUCOSE 145 (H) 03/28/2021   BUN 13 03/28/2021   CREATININE 0.72 03/28/2021   BILITOT 1.3 (H) 03/28/2021   ALKPHOS 69 03/28/2021   AST 29 03/28/2021   ALT 25 03/28/2021   PROT 6.2 (L) 03/28/2021   ALBUMIN 4.1 03/28/2021   CALCIUM 9.2 03/28/2021   ANIONGAP 8 03/28/2021   GFR 87.44 01/15/2021   Lab Results  Component Value Date   CHOL 254 (H) 01/15/2021   Lab Results  Component Value Date   HDL 102.90 01/15/2021   Lab Results  Component Value Date   LDLCALC 138 (H) 01/15/2021   Lab Results  Component Value Date   TRIG 63.0 01/15/2021   Lab Results  Component Value Date   CHOLHDL 2 01/15/2021   No results found for: HGBA1C    Assessment & Plan:   Problem List Items Addressed This Visit   None Visit Diagnoses     Severe hypertension    -  Primary   Relevant Orders   Metanephrines, urine, 24 hour   Aldosterone + renin activity w/ ratio   Basic metabolic panel      Patient reportedly never had hypertension until few months ago and was placed on HCTZ and over the past week has had very high blood pressure readings sporadically as above which seems very atypical.  Very labile blood pressures over past week.   She does have multiple nonspecific symptoms that seem to occur with her elevated readings including complaints of chills, headache, fatigue, dizziness.  Multiple recent screening labs as above are unremarkable and she had recent unremarkable echocardiogram. With the above, considering secondary causes of hypertension: Renovascular, primary hyperaldosteronism (recent K normal), pheochromocytoma.   She had recent normal calcium so primary hyperparathyroidism unlikely.   -Check 24 hour urine for metanephrines -Check aldosterone plus renin activity with ratio -Repeat basic metabolic panel -Consider renal duplex scan -Continue close monitoring  No orders of the defined types were placed in this encounter.   Follow-up: No follow-ups on file.    Carolann Littler, MD

## 2021-04-22 MED ORDER — HYDRALAZINE HCL 25 MG PO TABS: 25.0000 mg | ORAL_TABLET | Freq: Two times a day (BID) | ORAL | 0 refills | Status: DC | PRN

## 2021-04-22 NOTE — Telephone Encounter (Signed)
Hydralazine is prescribed by a provider not in this office.   Please advise.

## 2021-04-22 NOTE — Addendum Note (Signed)
Addended by: Rebecca Eaton on: 04/22/2021 01:23 PM   Modules accepted: Orders

## 2021-04-22 NOTE — Telephone Encounter (Signed)
Rx sent in

## 2021-04-28 LAB — METANEPHRINES, URINE, 24 HOUR
METANEPHRINE: 54 mcg/24 h — ABNORMAL LOW (ref 90–315)
METANEPHRINES, TOTAL: 255 mcg/24 h (ref 224–832)
NORMETANEPHRINE: 201 mcg/24 h (ref 122–676)
Total Volume: 3000 mL

## 2021-04-28 LAB — ALDOSTERONE + RENIN ACTIVITY W/ RATIO
ALDO / PRA Ratio: 6.3 Ratio (ref 0.9–28.9)
Aldosterone: 10 ng/dL
Renin Activity: 1.6 ng/mL/h (ref 0.25–5.82)

## 2021-05-05 ENCOUNTER — Other Ambulatory Visit: Payer: Self-pay

## 2021-05-05 ENCOUNTER — Ambulatory Visit (HOSPITAL_COMMUNITY)
Admission: RE | Admit: 2021-05-05 | Discharge: 2021-05-05 | Disposition: A | Discharge status: Home / Self Care | Payer: Medicare Other | Source: Ambulatory Visit | Attending: Cardiology | Admitting: Cardiology

## 2021-05-05 DIAGNOSIS — I10 Essential (primary) hypertension: Secondary | ICD-10-CM

## 2021-05-06 ENCOUNTER — Ambulatory Visit (INDEPENDENT_AMBULATORY_CARE_PROVIDER_SITE_OTHER): Payer: Medicare Other | Admitting: Family Medicine

## 2021-05-06 ENCOUNTER — Encounter: Payer: Self-pay | Admitting: Family Medicine

## 2021-05-06 VITALS — BP 134/60 | HR 60 | Temp 97.9°F | Wt 113.6 lb

## 2021-05-06 DIAGNOSIS — I10 Essential (primary) hypertension: Secondary | ICD-10-CM | POA: Diagnosis not present

## 2021-05-06 NOTE — Progress Notes (Signed)
Established Patient Office Visit  Subjective:  Patient ID: Nicole Snow, female    DOB: Aug 06, 1951  Age: 70 y.o. MRN: [id number]  CC:  Chief Complaint  Patient presents with   Follow-up    Patient hs BP readings from home with her    HPI [place] presents for follow-up regarding recent episodes of severe hypertension which were very abrupt and very episodic and associated with symptoms as previously noted.  Refer to office note of 04/21/2021 for details.  She also had ER visit because of symptomatic severe elevated blood pressure readings.  Her lab work was basically unremarkable.  She is generally very health-conscious and stays very active.  She was on HCTZ 25 mg daily and ER prescribed as needed hydralazine.  Because of the fact this was very abrupt new onset hypertension with severe readings and also very symptomatic we obtain urine metanephrines along with aldosterone plus renin activity and also set up vascular ultrasound renal artery to rule out stenosis.  Fortunately, all of these came back negative.  She states she feels much better overall now.  Less fatigue.  Hopes to start back exercise soon.  She has had some dizziness and lightheadedness and actually has not taken her HCTZ past couple days and blood pressure is remained stable.  She also had several questions today regarding Lipitor.  We have strongly encouraged her to discuss these with primary.  She has history of high cholesterol but also very high HDL.  No family history of premature CAD.  Past Medical History:  Diagnosis Date   Allergy    C. difficile colitis 08/2016   HTN (hypertension)     Past Surgical History:  Procedure Laterality Date   CHOLECYSTECTOMY     COLONOSCOPY  02/27/2009   T A polyp   HYSTEROSCOPY WITH D & C  1992   LASIK Bilateral    REPLACEMENT TOTAL HIP W/  RESURFACING IMPLANTS     TENDON REPAIR Right 1982   TENDON REPAIR     left hand ring finge at age 4 yr old    TONSILLECTOMY     WISDOM TOOTH EXTRACTION      Family History  Problem Relation Age of Onset   Dementia Mother    CVA Father 82   COPD Father    Colon cancer Neg Hx    Rectal cancer Neg Hx    Stomach cancer Neg Hx     Social History   Socioeconomic History   Marital status: Married    Spouse name: Not on file   Number of children: 1   Years of education: Not on file   Highest education level: Not on file  Occupational History   Occupation: retired-realtor  Tobacco Use   Smoking status: Never   Smokeless tobacco: Never  Vaping Use   Vaping Use: Never used  Substance and Sexual Activity   Alcohol use: Yes    Alcohol/week: 7.0 standard drinks    Types: 7 Glasses of wine per week    Comment: beer/wine   Drug use: Never   Sexual activity: Not on file  Other Topics Concern   Not on file  Social History Narrative   Not on file   Social Determinants of Health   Financial Resource Strain: Not on file  Food Insecurity: Not on file  Transportation Needs: Not on file  Physical Activity: Not on file  Stress: Not on file  Social Connections: Not on file  Intimate Partner Violence:  Not on file    Outpatient Medications Prior to Visit  Medication Sig Dispense Refill   alendronate (FOSAMAX) 70 MG tablet Take 1 tablet (70 mg total) by mouth every 7 (seven) days. Take with a full glass of water on an empty stomach. 4 tablet 11   atorvastatin (LIPITOR) 10 MG tablet Take 1 tablet (10 mg total) by mouth daily. 90 tablet 1   CALCIUM PO Take 500 mg by mouth daily.     cetirizine (ZYRTEC) 10 MG tablet Take by mouth.     Coenzyme Q10 (CO Q-10) 400 MG CAPS Take 1 capsule by mouth daily.     cyanocobalamin 1000 MCG tablet Take 1,000 mcg by mouth daily.     fluticasone (FLONASE) 50 MCG/ACT nasal spray Place 1 spray into both nostrils as needed.     Ginkgo 60 MG TABS Take 1 tablet by mouth daily.     hydrALAZINE (APRESOLINE) 25 MG tablet Take 1 tablet (25 mg total) by mouth 2 (two)  times daily as needed. 60 tablet 0   hydrochlorothiazide (HYDRODIURIL) 25 MG tablet Take 1 tablet (25 mg total) by mouth daily. 90 tablet 1   magnesium oxide (MAG-OX) 400 MG tablet Take 400 mg by mouth daily.     Multiple Vitamins-Minerals (EMERGEN-C IMMUNE PO) Take 1 capsule by mouth daily.     Probiotic Product (PROBIOTIC PO) Take 1 tablet by mouth daily.     Turmeric (QC TUMERIC COMPLEX PO) Take by mouth.     No facility-administered medications prior to visit.    Allergies  Allergen Reactions   Ciprofloxacin Hives   Clindamycin/Lincomycin Hives   Flagyl [Metronidazole] Hives   Hydrocodone Hives   Penicillins     ROS Review of Systems  Constitutional:  Negative for fatigue.  Eyes:  Negative for visual disturbance.  Respiratory:  Negative for cough, chest tightness, shortness of breath and wheezing.   Cardiovascular:  Negative for chest pain, palpitations and leg swelling.  Neurological:  Positive for dizziness and light-headedness. Negative for seizures, syncope, weakness and headaches.     Objective:    Physical Exam Vitals reviewed.  Constitutional:      Appearance: Normal appearance.  Cardiovascular:     Rate and Rhythm: Normal rate and regular rhythm.     Heart sounds: No murmur heard.   No gallop.  Pulmonary:     Effort: Pulmonary effort is normal.     Breath sounds: Normal breath sounds.  Musculoskeletal:     Right lower leg: No edema.     Left lower leg: No edema.  Neurological:     Mental Status: She is alert.    BP 134/60 (BP Location: Left Arm, Patient Position: Sitting, Cuff Size: Normal)   Pulse 60   Temp 97.9 F (36.6 C) (Oral)   Wt 113 lb 9.6 oz (51.5 kg)   LMP  (LMP Unknown)   SpO2 97%   BMI 17.27 kg/m  Wt Readings from Last 3 Encounters:  05/06/21 113 lb 9.6 oz (51.5 kg)  04/21/21 112 lb 9.6 oz (51.1 kg)  04/02/21 115 lb 9.6 oz (52.4 kg)     Health Maintenance Due  Topic Date Due   Hepatitis C Screening  Never done    There are  no preventive care reminders to display for this patient.  Lab Results  Component Value Date   TSH 1.125 03/28/2021   Lab Results  Component Value Date   WBC 4.6 03/28/2021   HGB 14.7 03/28/2021  HCT 42.5 03/28/2021   MCV 93.4 03/28/2021   PLT 139 (L) 03/28/2021   Lab Results  Component Value Date   NA 136 04/21/2021   K 3.7 04/21/2021   CO2 28 04/21/2021   GLUCOSE 87 04/21/2021   BUN 11 04/21/2021   CREATININE 0.76 04/21/2021   BILITOT 1.3 (H) 03/28/2021   ALKPHOS 69 03/28/2021   AST 29 03/28/2021   ALT 25 03/28/2021   PROT 6.2 (L) 03/28/2021   ALBUMIN 4.1 03/28/2021   CALCIUM 10.3 04/21/2021   ANIONGAP 8 03/28/2021   GFR 80.43 04/21/2021   Lab Results  Component Value Date   CHOL 254 (H) 01/15/2021   Lab Results  Component Value Date   HDL 102.90 01/15/2021   Lab Results  Component Value Date   LDLCALC 138 (H) 01/15/2021   Lab Results  Component Value Date   TRIG 63.0 01/15/2021   Lab Results  Component Value Date   CHOLHDL 2 01/15/2021   No results found for: HGBA1C    Assessment & Plan:   Recent severe hypertension with multiple somatic symptoms.  Evaluation for secondary causes as above unremarkable.  Fortunately, her blood pressures have stabilized at this time.  In fact, I obtained reading of 128/78 seated and 120/70 standing.  She has not taken her HCTZ past couple of days.  She will hold for now and monitor blood pressure closely.  If systolic readings start climbing over 140 consistently start back 1/2 tablet of HCTZ 25 mg and follow-up with Dr. Jerilee Hoh  Patient had questions regarding whether she could resume exercise and we explained that we do not see any reason she cannot at this time.  She will ease back in gradually.  No orders of the defined types were placed in this encounter.   Follow-up: No follow-ups on file.    Carolann Littler, MD

## 2021-05-06 NOTE — Patient Instructions (Signed)
Monitor blood pressure off the HCTZ or at least decrease the HCTZ to one half tablet daily.   If BP starts to climb consistently over 140 go back on full dose and follow up with Dr Lemmie Evens.

## 2021-05-09 ENCOUNTER — Encounter: Payer: Self-pay | Admitting: Family Medicine

## 2021-05-14 ENCOUNTER — Other Ambulatory Visit: Payer: Self-pay | Admitting: Family Medicine

## 2021-06-10 ENCOUNTER — Other Ambulatory Visit: Payer: Self-pay | Admitting: Internal Medicine

## 2021-07-04 ENCOUNTER — Other Ambulatory Visit: Payer: Self-pay | Admitting: Internal Medicine

## 2021-07-04 MED ORDER — HYDRALAZINE HCL 25 MG PO TABS: 25.0000 mg | ORAL_TABLET | Freq: Two times a day (BID) | ORAL | 0 refills | Status: DC | PRN

## 2021-07-04 NOTE — Addendum Note (Signed)
Addended by: Westley Hummer B on: 07/04/2021 01:29 PM   Modules accepted: Orders

## 2021-07-07 ENCOUNTER — Other Ambulatory Visit: Payer: Self-pay

## 2021-07-07 ENCOUNTER — Encounter: Payer: Self-pay | Admitting: Podiatry

## 2021-07-07 ENCOUNTER — Ambulatory Visit (INDEPENDENT_AMBULATORY_CARE_PROVIDER_SITE_OTHER): Payer: Medicare Other | Admitting: Podiatry

## 2021-07-07 DIAGNOSIS — B070 Plantar wart: Secondary | ICD-10-CM | POA: Diagnosis not present

## 2021-07-08 NOTE — Progress Notes (Signed)
Subjective:   Patient ID: Nicole Snow, female   DOB: 70 y.o.   MRN: [id number]   HPI Patient presents with painful lesion on the bottom of the left foot stating that this is been going on for a while   ROS      Objective:  Physical Exam  Neurovascular status intact with patient found to have keratotic lesion plantar left that upon debridement shows pinpoint bleeding with approximate 7 x 7 mm in size     Assessment:  Appears to be verruca plantaris plantar left cannot rule out porokeratotic lesion or other pathology     Plan:  H&P and I educated her on this debrided the lesion applied sterile pad around the area applied medication to create immune response and applied sterile dressing.  Gave instructions on leaving this on 24 hours and what to do if any blistering were to occur

## 2021-07-15 ENCOUNTER — Encounter: Payer: Self-pay | Admitting: Internal Medicine

## 2021-07-15 DIAGNOSIS — M79606 Pain in leg, unspecified: Secondary | ICD-10-CM

## 2021-07-15 DIAGNOSIS — E785 Hyperlipidemia, unspecified: Secondary | ICD-10-CM

## 2021-07-23 NOTE — Addendum Note (Signed)
Addended by: Westley Hummer B on: 07/23/2021 10:39 AM   Modules accepted: Orders

## 2021-08-26 ENCOUNTER — Other Ambulatory Visit (INDEPENDENT_AMBULATORY_CARE_PROVIDER_SITE_OTHER): Payer: Medicare Other

## 2021-08-26 ENCOUNTER — Other Ambulatory Visit: Payer: Self-pay

## 2021-08-26 DIAGNOSIS — E785 Hyperlipidemia, unspecified: Secondary | ICD-10-CM

## 2021-08-26 LAB — LIPID PANEL
Cholesterol: 213 mg/dL — ABNORMAL HIGH (ref 0–200)
HDL: 89 mg/dL (ref >39.00)
LDL Cholesterol: 110 mg/dL — ABNORMAL HIGH (ref 0–99)
NonHDL: 123.54
Total CHOL/HDL Ratio: 2
Triglycerides: 66 mg/dL (ref 0.0–149.0)
VLDL: 13.2 mg/dL (ref 0.0–40.0)

## 2021-09-12 ENCOUNTER — Other Ambulatory Visit: Payer: Self-pay | Admitting: Internal Medicine

## 2021-09-12 DIAGNOSIS — Z1231 Encounter for screening mammogram for malignant neoplasm of breast: Secondary | ICD-10-CM

## 2021-10-14 ENCOUNTER — Encounter: Payer: Self-pay | Admitting: Internal Medicine

## 2021-10-14 DIAGNOSIS — I10 Essential (primary) hypertension: Secondary | ICD-10-CM

## 2021-10-14 DIAGNOSIS — E785 Hyperlipidemia, unspecified: Secondary | ICD-10-CM

## 2021-10-14 MED ORDER — ATORVASTATIN CALCIUM 10 MG PO TABS: 10.0000 mg | ORAL_TABLET | Freq: Every day | ORAL | 1 refills | Status: DC

## 2021-10-14 MED ORDER — HYDROCHLOROTHIAZIDE 25 MG PO TABS: 25.0000 mg | ORAL_TABLET | Freq: Every day | ORAL | 1 refills | Status: DC

## 2021-10-16 ENCOUNTER — Ambulatory Visit
Admission: RE | Admit: 2021-10-16 | Discharge: 2021-10-16 | Disposition: A | Discharge status: Home / Self Care | Payer: Medicare Other | Source: Ambulatory Visit | Attending: Internal Medicine | Admitting: Internal Medicine

## 2021-10-16 DIAGNOSIS — Z1231 Encounter for screening mammogram for malignant neoplasm of breast: Secondary | ICD-10-CM

## 2021-11-23 ENCOUNTER — Encounter: Payer: Self-pay | Admitting: Internal Medicine

## 2021-11-23 DIAGNOSIS — I10 Essential (primary) hypertension: Secondary | ICD-10-CM

## 2021-11-24 MED ORDER — HYDROCHLOROTHIAZIDE 25 MG PO TABS: 25.0000 mg | ORAL_TABLET | Freq: Every day | ORAL | 0 refills | Status: DC

## 2021-12-18 IMAGING — CT CT TEMPORAL BONES W/O CM
2 of 7 series · 11 of 40 positions shown, 14 images · non-contrast
Comparison: None.

CLINICAL DATA: Left external ear canal stenosis.

EXAM:
CT TEMPORAL BONES WITHOUT CONTRAST
TECHNIQUE: Axial and coronal plane CT imaging of the petrous temporal bones was
performed with thin-collimation image reconstruction. No intravenous
contrast was administered. Multiplanar CT image reconstructions were
also generated.

[Series 3: temporal bones 0.60 hr60 ax bone · axial · 0.41mm/px · z∈[-687,-615]mm · 9 of 152 slices shown, 12 images]
[im 16/152  brain]
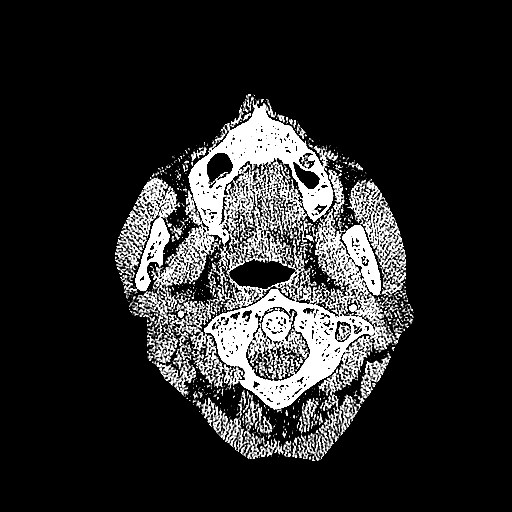
[im 16/152  bone]
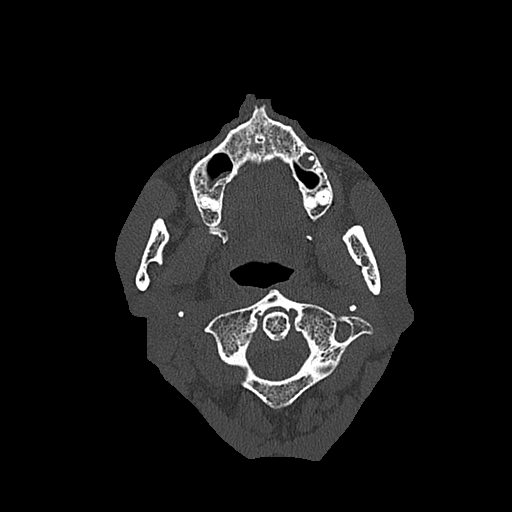
[im 31/152  bone]
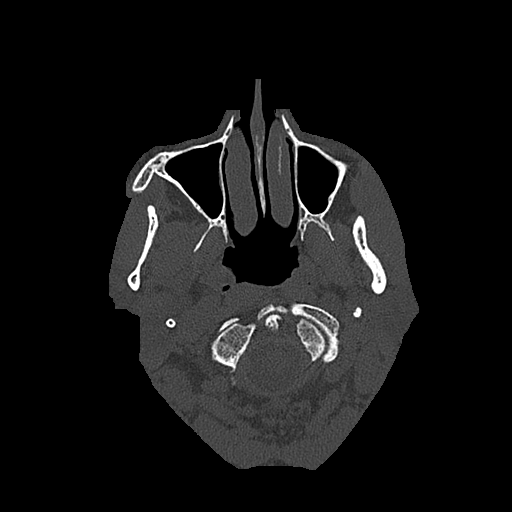
[im 46/152  bone]
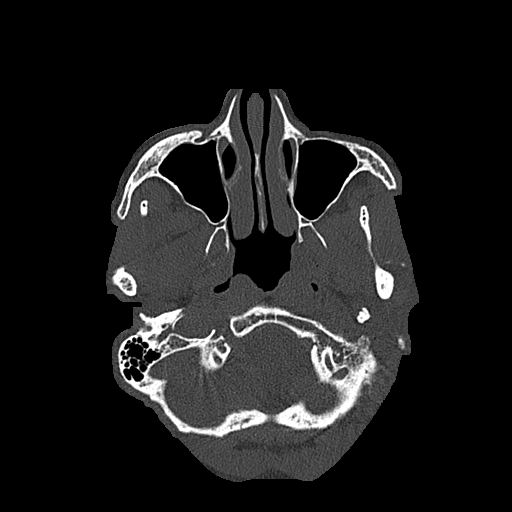
[im 61/152  bone]
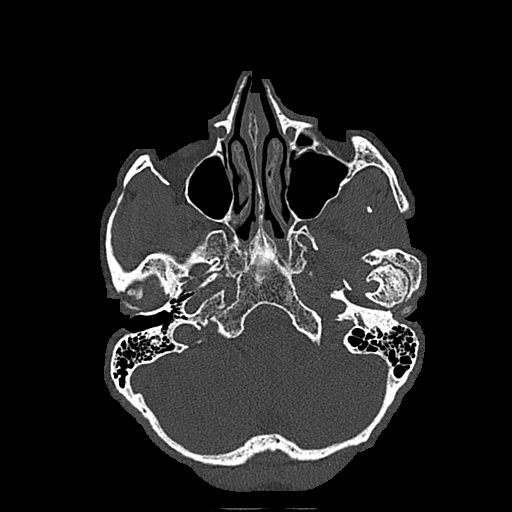
[im 76/152  brain]
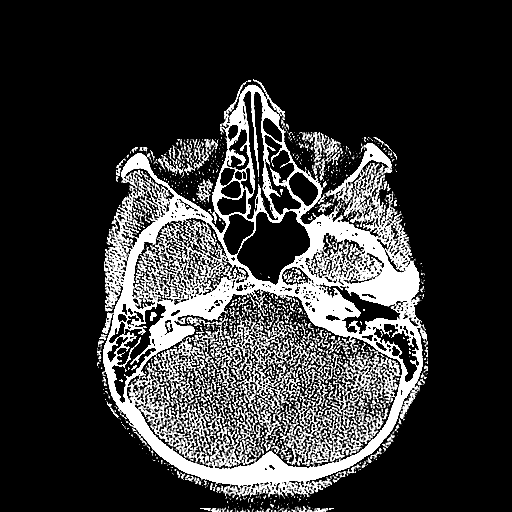
[im 76/152  bone]
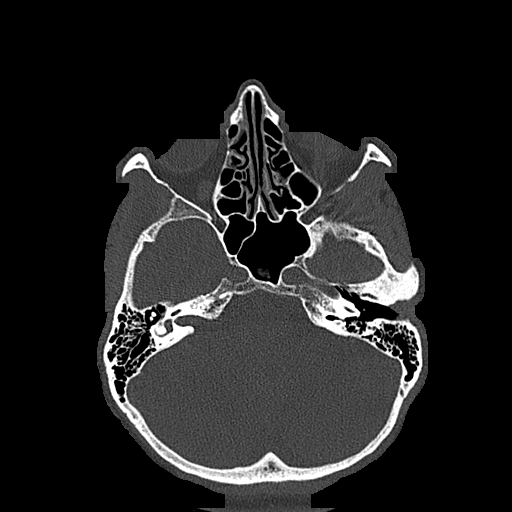
[im 91/152  bone]
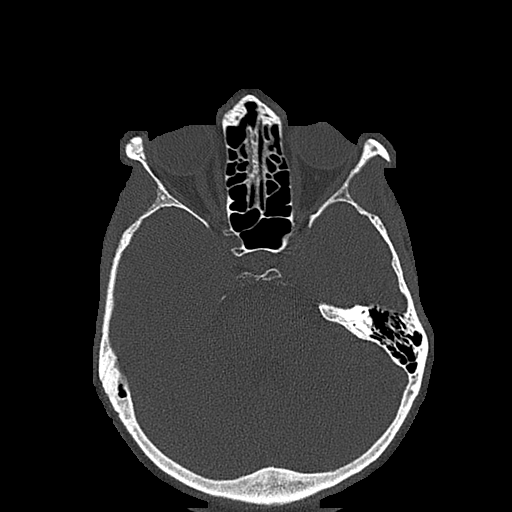
[im 106/152  bone]
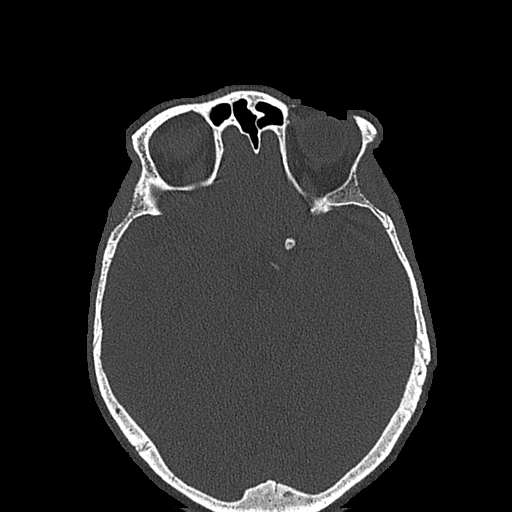
[im 121/152  bone]
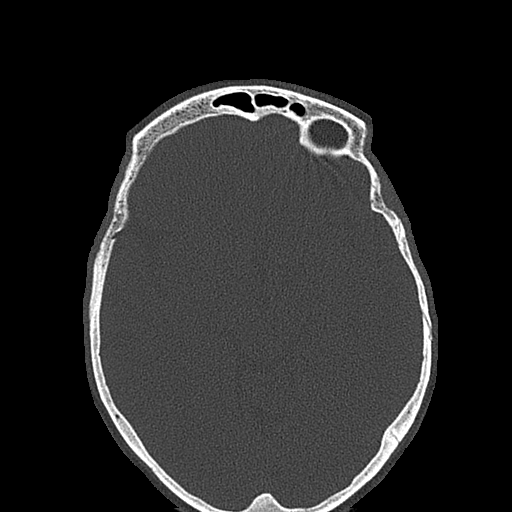
[im 136/152  brain]
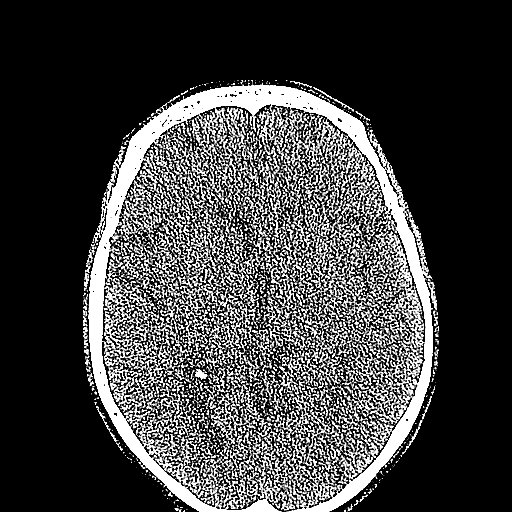
[im 136/152  bone]
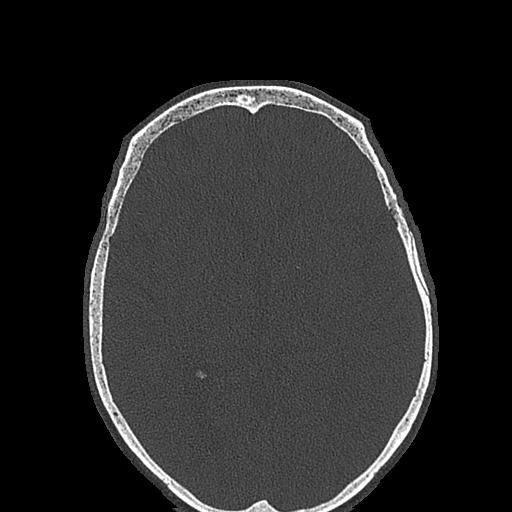

[Series 11: temporal bones 0.80 hr60 cor rt mag · coronal · 0.19mm/px · 2 of 161 slices shown]
[im 54/161  bone]
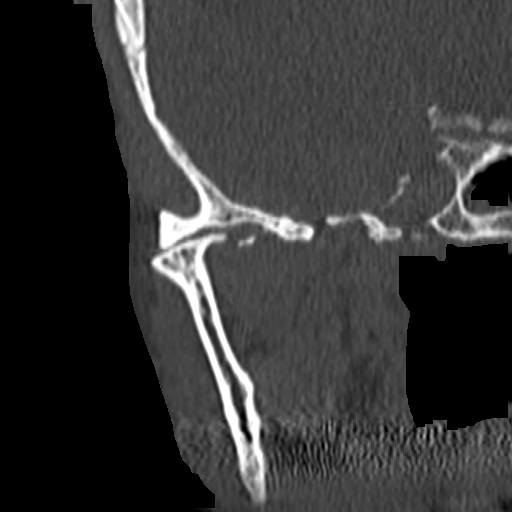
[im 107/161  bone]
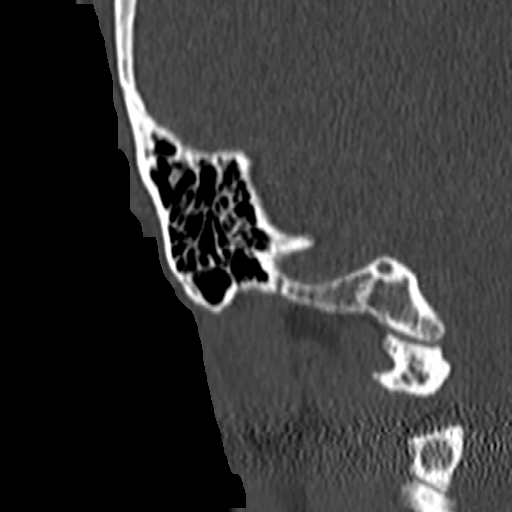

[11 of 40 positions shown; findings below may reference images not displayed]

FINDINGS: RIGHT:The external auditory canal is patent.The middle ear cavity is
well-aerated.The ossicles are unremarkable.Uncovering of the
superior semi circular canal. The internal auditory and facial nerve
canals are normal.The mastoid air cells are well-aerated.

LEFT:Severe degenerative changes of the left temporomandibular joint
with a focal defect of the tympanic plate and associated soft tissue
and bony fragment herniation into the left external auditory
canal.The middle ear cavity is well-aerated.The ossicles are
unremarkable.The inner ear structures, internal auditory and facial
nerve canals are normal.The mastoid air cells are well-aerated.

Mucous retention cyst with associated coarse calcification in the
left sphenoid sinus.

Periapical lucency of the left maxillary second premolar tooth.

The imaged orbits demonstrate no acute abnormality.

Advanced degenerative changes of the bilateral temporomandibular
joints.
IMPRESSION: 1. Severe degenerative changes of the left temporomandibular joint
with a focal defect of the tympanic plate and associated soft tissue
and bony fragment herniation into the left external auditory canal.
2. Right superior semi circular canal dehiscence.
3. Periapical lucency of the left maxillary second premolar tooth.
Correlate with dental exam.

## 2021-12-29 ENCOUNTER — Encounter: Payer: Self-pay | Admitting: Internal Medicine

## 2021-12-30 ENCOUNTER — Telehealth: Payer: Self-pay | Admitting: Internal Medicine

## 2022-01-02 ENCOUNTER — Other Ambulatory Visit: Payer: Self-pay

## 2022-01-02 ENCOUNTER — Encounter: Payer: Self-pay | Admitting: Podiatry

## 2022-01-02 ENCOUNTER — Ambulatory Visit (INDEPENDENT_AMBULATORY_CARE_PROVIDER_SITE_OTHER): Payer: Medicare Other | Admitting: Podiatry

## 2022-01-02 DIAGNOSIS — B070 Plantar wart: Secondary | ICD-10-CM | POA: Diagnosis not present

## 2022-01-04 NOTE — Progress Notes (Signed)
Subjective:  ? ?Patient ID: Nicole Snow, female   DOB: 71 y.o.   MRN: [id number]  ? ?HPI ?Patient presents stating she has had reoccurrence of lesion plantar aspect left that is sore again and did well for around 6 months ? ? ?ROS ? ? ?   ?Objective:  ?Physical Exam  ?Neurovascular status intact keratotic lesion submetatarsal with pinpoint bleeding upon debridement lateral pressure pain ? ?   ?Assessment:  ?Reoccurrence verruca plantar aspect left ? ?   ?Plan:  ?Martin Majestic ahead today debrided lesion with sharp instrumentation and went ahead today and applied chemical agent to create immune response sterile dressing instructed what to do if any blistering were to occur ?   ? ? ?

## 2022-01-12 NOTE — Telephone Encounter (Signed)
ERROR

## 2022-01-23 ENCOUNTER — Ambulatory Visit (INDEPENDENT_AMBULATORY_CARE_PROVIDER_SITE_OTHER): Payer: Medicare Other | Admitting: Family Medicine

## 2022-01-23 ENCOUNTER — Encounter: Payer: Self-pay | Admitting: Family Medicine

## 2022-01-23 VITALS — BP 120/60 | HR 66 | Temp 98.1°F | Ht 68.0 in | Wt 117.6 lb

## 2022-01-23 DIAGNOSIS — J019 Acute sinusitis, unspecified: Secondary | ICD-10-CM

## 2022-01-23 DIAGNOSIS — R059 Cough, unspecified: Secondary | ICD-10-CM

## 2022-01-23 MED ORDER — DOXYCYCLINE HYCLATE 100 MG PO CAPS: 100.0000 mg | ORAL_CAPSULE | Freq: Two times a day (BID) | ORAL | 0 refills | Status: DC

## 2022-01-23 MED ORDER — BENZONATATE 100 MG PO CAPS: 100.0000 mg | ORAL_CAPSULE | Freq: Three times a day (TID) | ORAL | 0 refills | Status: DC | PRN

## 2022-01-23 NOTE — Progress Notes (Signed)
? ?Established Patient Office Visit ? ?Subjective:  ?Patient ID: Nicole Snow, female    DOB: 06-24-1951  Age: 71 y.o. MRN: [id number] ? ?CC:  ?Chief Complaint  ?Patient presents with  ? Cough  ?  Patient complains of cough, Productive cough, x10 days   ? ? ?HPI ?Tarkio presents for 4 to 5-day history of cough.  No fever.  Home COVID test negative.  No body aches.  She has had some postnasal drainage.  She had a few days of some increased colored nasal discharge.  She is worried about developing sinusitis.  Cough has affected her sleep.  No dyspnea.  No pleuritic pain.  No chest pains.  Non-smoker ? ?Past Medical History:  ?Diagnosis Date  ? Allergy   ? C. difficile colitis 08/2016  ? HTN (hypertension)   ? ? ?Past Surgical History:  ?Procedure Laterality Date  ? CHOLECYSTECTOMY    ? COLONOSCOPY  02/27/2009  ? T A polyp  ? HYSTEROSCOPY WITH D & C  1992  ? LASIK Bilateral   ? REPLACEMENT TOTAL HIP W/  RESURFACING IMPLANTS    ? TENDON REPAIR Right 1982  ? TENDON REPAIR    ? left hand ring finge at age 31 yr old  ? TONSILLECTOMY    ? WISDOM TOOTH EXTRACTION    ? ? ?Family History  ?Problem Relation Age of Onset  ? Dementia Mother   ? CVA Father 42  ? COPD Father   ? Colon cancer Neg Hx   ? Rectal cancer Neg Hx   ? Stomach cancer Neg Hx   ? ? ?Social History  ? ?Socioeconomic History  ? Marital status: Married  ?  Spouse name: Not on file  ? Number of children: 1  ? Years of education: Not on file  ? Highest education level: Not on file  ?Occupational History  ? Occupation: retired-realtor  ?Tobacco Use  ? Smoking status: Never  ? Smokeless tobacco: Never  ?Vaping Use  ? Vaping Use: Never used  ?Substance and Sexual Activity  ? Alcohol use: Yes  ?  Alcohol/week: 7.0 standard drinks  ?  Types: 7 Glasses of wine per week  ?  Comment: beer/wine  ? Drug use: Never  ? Sexual activity: Not on file  ?Other Topics Concern  ? Not on file  ?Social History Narrative  ? Not on file  ? ?Social  Determinants of Health  ? ?Financial Resource Strain: Not on file  ?Food Insecurity: Not on file  ?Transportation Needs: Not on file  ?Physical Activity: Not on file  ?Stress: Not on file  ?Social Connections: Not on file  ?Intimate Partner Violence: Not on file  ? ? ?Outpatient Medications Prior to Visit  ?Medication Sig Dispense Refill  ? alendronate (FOSAMAX) 70 MG tablet Take 1 tablet (70 mg total) by mouth every 7 (seven) days. Take with a full glass of water on an empty stomach. 4 tablet 11  ? atorvastatin (LIPITOR) 10 MG tablet Take 1 tablet (10 mg total) by mouth daily. 90 tablet 1  ? CALCIUM PO Take 500 mg by mouth daily.    ? cetirizine (ZYRTEC) 10 MG tablet Take by mouth.    ? Coenzyme Q10 (CO Q-10) 400 MG CAPS Take 1 capsule by mouth daily.    ? cyanocobalamin 1000 MCG tablet Take 1,000 mcg by mouth daily.    ? fluticasone (FLONASE) 50 MCG/ACT nasal spray Place 1 spray into both nostrils as needed.    ?  Ginkgo 60 MG TABS Take 1 tablet by mouth daily.    ? hydrALAZINE (APRESOLINE) 25 MG tablet Take 1 tablet (25 mg total) by mouth 2 (two) times daily as needed. 60 tablet 0  ? hydrochlorothiazide (HYDRODIURIL) 25 MG tablet Take 1 tablet (25 mg total) by mouth daily. 90 tablet 0  ? magnesium oxide (MAG-OX) 400 MG tablet Take 400 mg by mouth daily.    ? Multiple Vitamins-Minerals (EMERGEN-C IMMUNE PO) Take 1 capsule by mouth daily.    ? Probiotic Product (PROBIOTIC PO) Take 1 tablet by mouth daily.    ? Turmeric (QC TUMERIC COMPLEX PO) Take by mouth.    ? ?No facility-administered medications prior to visit.  ? ? ?Allergies  ?Allergen Reactions  ? Ciprofloxacin Hives  ? Clindamycin/Lincomycin Hives  ? Flagyl [Metronidazole] Hives  ? Hydrocodone Hives  ? Penicillins   ? ? ?ROS ?Review of Systems  ?Constitutional:  Negative for chills and fever.  ?HENT:  Positive for postnasal drip.   ?Respiratory:  Positive for cough. Negative for shortness of breath and wheezing.   ?Cardiovascular:  Negative for chest pain.   ? ?  ?Objective:  ?  ?Physical Exam ?Vitals reviewed.  ?Constitutional:   ?   Appearance: Normal appearance.  ?HENT:  ?   Ears:  ?   Comments: She has very narrow left ear canal which makes visualization of eardrum normal.  Right eardrum is normal ?   Mouth/Throat:  ?   Comments: Previous tonsillectomy.  No erythema.  No exudate. ?Cardiovascular:  ?   Rate and Rhythm: Normal rate and regular rhythm.  ?Pulmonary:  ?   Effort: Pulmonary effort is normal.  ?   Breath sounds: Normal breath sounds. No wheezing or rales.  ?Musculoskeletal:  ?   Cervical back: Neck supple.  ?Neurological:  ?   Mental Status: She is alert.  ? ? ?BP 120/60 (BP Location: Left Arm, Patient Position: Sitting, Cuff Size: Normal)   Pulse 66   Temp 98.1 ?F (36.7 ?C) (Oral)   Ht '5\' 8"'$  (1.727 m)   Wt 117 lb 9.6 oz (53.3 kg)   LMP  (LMP Unknown)   SpO2 96%   BMI 17.88 kg/m?  ?Wt Readings from Last 3 Encounters:  ?01/23/22 117 lb 9.6 oz (53.3 kg)  ?05/06/21 113 lb 9.6 oz (51.5 kg)  ?04/21/21 112 lb 9.6 oz (51.1 kg)  ? ? ? ?Health Maintenance Due  ?Topic Date Due  ? Hepatitis C Screening  Never done  ? Pneumonia Vaccine 62+ Years old (2 - PPSV23 if available, else PCV20) 12/24/2019  ? ? ?There are no preventive care reminders to display for this patient. ? ?Lab Results  ?Component Value Date  ? TSH 1.125 03/28/2021  ? ?Lab Results  ?Component Value Date  ? WBC 4.6 03/28/2021  ? HGB 14.7 03/28/2021  ? HCT 42.5 03/28/2021  ? MCV 93.4 03/28/2021  ? PLT 139 (L) 03/28/2021  ? ?Lab Results  ?Component Value Date  ? NA 136 04/21/2021  ? K 3.7 04/21/2021  ? CO2 28 04/21/2021  ? GLUCOSE 87 04/21/2021  ? BUN 11 04/21/2021  ? CREATININE 0.76 04/21/2021  ? BILITOT 1.3 (H) 03/28/2021  ? ALKPHOS 69 03/28/2021  ? AST 29 03/28/2021  ? ALT 25 03/28/2021  ? PROT 6.2 (L) 03/28/2021  ? ALBUMIN 4.1 03/28/2021  ? CALCIUM 10.3 04/21/2021  ? ANIONGAP 8 03/28/2021  ? GFR 80.43 04/21/2021  ? ?Lab Results  ?Component Value Date  ? CHOL 213 (  H) 08/26/2021  ? ?Lab Results   ?Component Value Date  ? HDL 89.00 08/26/2021  ? ?Lab Results  ?Component Value Date  ? LDLCALC 110 (H) 08/26/2021  ? ?Lab Results  ?Component Value Date  ? TRIG 66.0 08/26/2021  ? ?Lab Results  ?Component Value Date  ? CHOLHDL 2 08/26/2021  ? ?No results found for: HGBA1C ? ?  ?Assessment & Plan:  ? ?Cough with possible early sinusitis.  Tessalon Perles 100 mg every 8 hours as needed for cough.  Patient is concerned about developing sinusitis.  She has been prone to this in the past.  We agreed to send in doxycycline 100 mg twice daily for 10 days.  Follow-up for any persistent or worsening symptoms. ? ?Meds ordered this encounter  ?Medications  ? doxycycline (VIBRAMYCIN) 100 MG capsule  ?  Sig: Take 1 capsule (100 mg total) by mouth 2 (two) times daily.  ?  Dispense:  20 capsule  ?  Refill:  0  ? benzonatate (TESSALON PERLES) 100 MG capsule  ?  Sig: Take 1 capsule (100 mg total) by mouth 3 (three) times daily as needed for cough.  ?  Dispense:  30 capsule  ?  Refill:  0  ? ? ?Follow-up: No follow-ups on file.  ? ? ?Carolann Littler, MD ?

## 2022-01-29 ENCOUNTER — Ambulatory Visit (INDEPENDENT_AMBULATORY_CARE_PROVIDER_SITE_OTHER): Payer: Medicare Other | Admitting: Internal Medicine

## 2022-01-29 ENCOUNTER — Encounter: Payer: Self-pay | Admitting: Internal Medicine

## 2022-01-29 VITALS — BP 124/80 | HR 59 | Temp 97.5°F | Ht 68.0 in | Wt 113.7 lb

## 2022-01-29 DIAGNOSIS — E782 Mixed hyperlipidemia: Secondary | ICD-10-CM

## 2022-01-29 DIAGNOSIS — Z780 Asymptomatic menopausal state: Secondary | ICD-10-CM | POA: Diagnosis not present

## 2022-01-29 DIAGNOSIS — Z0000 Encounter for general adult medical examination without abnormal findings: Secondary | ICD-10-CM | POA: Diagnosis not present

## 2022-01-29 DIAGNOSIS — I10 Essential (primary) hypertension: Secondary | ICD-10-CM | POA: Diagnosis not present

## 2022-01-29 DIAGNOSIS — M810 Age-related osteoporosis without current pathological fracture: Secondary | ICD-10-CM

## 2022-01-29 LAB — COMPREHENSIVE METABOLIC PANEL
ALT: 18 U/L (ref 0–35)
AST: 26 U/L (ref 0–37)
Albumin: 4.4 g/dL (ref 3.5–5.2)
Alkaline Phosphatase: 66 U/L (ref 39–117)
BUN: 13 mg/dL (ref 6–23)
CO2: 30 mEq/L (ref 19–32)
Calcium: 9.8 mg/dL (ref 8.4–10.5)
Chloride: 99 mEq/L (ref 96–112)
Creatinine, Ser: 0.68 mg/dL (ref 0.40–1.20)
GFR: 88.91 mL/min (ref >60.00)
Glucose, Bld: 95 mg/dL (ref 70–99)
Potassium: 4 mEq/L (ref 3.5–5.1)
Sodium: 135 mEq/L (ref 135–145)
Total Bilirubin: 1.2 mg/dL (ref 0.2–1.2)
Total Protein: 6.5 g/dL (ref 6.0–8.3)

## 2022-01-29 LAB — CBC WITH DIFFERENTIAL/PLATELET
Basophils Absolute: 0 10*3/uL (ref 0.0–0.1)
Basophils Relative: 0.9 % (ref 0.0–3.0)
Eosinophils Absolute: 0.1 10*3/uL (ref 0.0–0.7)
Eosinophils Relative: 1.9 % (ref 0.0–5.0)
HCT: 43.4 % (ref 36.0–46.0)
Hemoglobin: 14.8 g/dL (ref 12.0–15.0)
Lymphocytes Relative: 32.3 % (ref 12.0–46.0)
Lymphs Abs: 1.4 10*3/uL (ref 0.7–4.0)
MCHC: 34 g/dL (ref 30.0–36.0)
MCV: 95.2 fl (ref 78.0–100.0)
Monocytes Absolute: 0.3 10*3/uL (ref 0.1–1.0)
Monocytes Relative: 6.7 % (ref 3.0–12.0)
Neutro Abs: 2.5 10*3/uL (ref 1.4–7.7)
Neutrophils Relative %: 58.2 % (ref 43.0–77.0)
Platelets: 151 10*3/uL (ref 150.0–400.0)
RBC: 4.56 Mil/uL (ref 3.87–5.11)
RDW: 12.3 % (ref 11.5–15.5)
WBC: 4.4 10*3/uL (ref 4.0–10.5)

## 2022-01-29 LAB — LIPID PANEL
Cholesterol: 215 mg/dL — ABNORMAL HIGH (ref 0–200)
HDL: 88.5 mg/dL (ref >39.00)
LDL Cholesterol: 113 mg/dL — ABNORMAL HIGH (ref 0–99)
NonHDL: 126.45
Total CHOL/HDL Ratio: 2
Triglycerides: 65 mg/dL (ref 0.0–149.0)
VLDL: 13 mg/dL (ref 0.0–40.0)

## 2022-01-29 LAB — VITAMIN D 25 HYDROXY (VIT D DEFICIENCY, FRACTURES): VITD: 46.09 ng/mL (ref 30.00–100.00)

## 2022-01-29 MED ORDER — HYDROCHLOROTHIAZIDE 25 MG PO TABS: 25.0000 mg | ORAL_TABLET | Freq: Every day | ORAL | 1 refills | Status: DC

## 2022-01-29 NOTE — Progress Notes (Signed)
? ? ? ?Established Patient Office Visit ? ? ? ? ?This visit occurred during the SARS-CoV-2 public health emergency.  Safety protocols were in place, including screening questions prior to the visit, additional usage of staff PPE, and extensive cleaning of exam room while observing appropriate contact time as indicated for disinfecting solutions.  ? ? ?CC/Reason for Visit: Annual preventive exam and subsequent Medicare wellness visit ? ?HPI: Nicole Snow is a 71 y.o. female who is coming in today for the above mentioned reasons. Past Medical History is significant for: Hypertension, hyperlipidemia and osteoporosis.  She feels well and has no acute concerns or complaints.  She has routine eye and dental care.  She discontinued her statin due to muscle aches that resolved after discontinuation.  She also discontinued her alendronate after she read more about potential side effects.  She had a Pap smear at age 82 and was told to have a repeat in 5 years.  Colonoscopy and mammogram are up-to-date.  All immunizations are up-to-date and age-appropriate. ? ? ?Past Medical/Surgical History: ?Past Medical History:  ?Diagnosis Date  ? Allergy   ? C. difficile colitis 08/2016  ? HTN (hypertension)   ? ? ?Past Surgical History:  ?Procedure Laterality Date  ? CHOLECYSTECTOMY    ? COLONOSCOPY  02/27/2009  ? T A polyp  ? HYSTEROSCOPY WITH D & C  1992  ? LASIK Bilateral   ? REPLACEMENT TOTAL HIP W/  RESURFACING IMPLANTS    ? TENDON REPAIR Right 1982  ? TENDON REPAIR    ? left hand ring finge at age 36 yr old  ? TONSILLECTOMY    ? WISDOM TOOTH EXTRACTION    ? ? ?Social History: ? reports that she has never smoked. She has never used smokeless tobacco. She reports current alcohol use of about 7.0 standard drinks per week. She reports that she does not use drugs. ? ?Allergies: ?Allergies  ?Allergen Reactions  ? Ciprofloxacin Hives  ? Flagyl [Metronidazole] Hives  ? Penicillins   ? ? ?Family History:  ?Family History   ?Problem Relation Age of Onset  ? Dementia Mother   ? CVA Father 82  ? COPD Father   ? Colon cancer Neg Hx   ? Rectal cancer Neg Hx   ? Stomach cancer Neg Hx   ? ? ? ?Current Outpatient Medications:  ?  benzonatate (TESSALON PERLES) 100 MG capsule, Take 1 capsule (100 mg total) by mouth 3 (three) times daily as needed for cough., Disp: 30 capsule, Rfl: 0 ?  CALCIUM PO, Take 500 mg by mouth daily., Disp: , Rfl:  ?  cetirizine (ZYRTEC) 10 MG tablet, Take by mouth., Disp: , Rfl:  ?  cyanocobalamin 1000 MCG tablet, Take 1,000 mcg by mouth daily., Disp: , Rfl:  ?  doxycycline (VIBRAMYCIN) 100 MG capsule, Take 1 capsule (100 mg total) by mouth 2 (two) times daily., Disp: 20 capsule, Rfl: 0 ?  fluticasone (FLONASE) 50 MCG/ACT nasal spray, Place 1 spray into both nostrils as needed., Disp: , Rfl:  ?  hydrALAZINE (APRESOLINE) 25 MG tablet, Take 1 tablet (25 mg total) by mouth 2 (two) times daily as needed., Disp: 60 tablet, Rfl: 0 ?  magnesium oxide (MAG-OX) 400 MG tablet, Take 400 mg by mouth daily., Disp: , Rfl:  ?  Multiple Vitamins-Minerals (EMERGEN-C IMMUNE PO), Take 1 capsule by mouth daily., Disp: , Rfl:  ?  Probiotic Product (PROBIOTIC PO), Take 1 tablet by mouth daily., Disp: , Rfl:  ?  hydrochlorothiazide (  HYDRODIURIL) 25 MG tablet, Take 1 tablet (25 mg total) by mouth daily., Disp: 90 tablet, Rfl: 1 ? ?Review of Systems:  ?Constitutional: Denies fever, chills, diaphoresis, appetite change and fatigue.  ?HEENT: Denies photophobia, eye pain, redness, hearing loss, ear pain, congestion, sore throat, rhinorrhea, sneezing, mouth sores, trouble swallowing, neck pain, neck stiffness and tinnitus.   ?Respiratory: Denies SOB, DOE, cough, chest tightness,  and wheezing.   ?Cardiovascular: Denies chest pain, palpitations and leg swelling.  ?Gastrointestinal: Denies nausea, vomiting, abdominal pain, diarrhea, constipation, blood in stool and abdominal distention.  ?Genitourinary: Denies dysuria, urgency, frequency,  hematuria, flank pain and difficulty urinating.  ?Endocrine: Denies: hot or cold intolerance, sweats, changes in hair or nails, polyuria, polydipsia. ?Musculoskeletal: Denies myalgias, back pain, joint swelling, arthralgias and gait problem.  ?Skin: Denies pallor, rash and wound.  ?Neurological: Denies dizziness, seizures, syncope, weakness, light-headedness, numbness and headaches.  ?Hematological: Denies adenopathy. Easy bruising, personal or family bleeding history  ?Psychiatric/Behavioral: Denies suicidal ideation, mood changes, confusion, nervousness, sleep disturbance and agitation ? ? ? ?Physical Exam: ?Vitals:  ? 01/29/22 0804  ?BP: 124/80  ?Pulse: (!) 59  ?Temp: (!) 97.5 ?F (36.4 ?C)  ?TempSrc: Oral  ?SpO2: 99%  ?Weight: 113 lb 11.2 oz (51.6 kg)  ?Height: '5\' 8"'$  (1.727 m)  ? ? ?Body mass index is 17.29 kg/m?. ? ? ?Constitutional: NAD, calm, comfortable ?Eyes: PERRL, lids and conjunctivae normal, wears corrective lenses ?ENMT: Mucous membranes are moist. Posterior pharynx clear of any exudate or lesions. Normal dentition. Tympanic membrane is pearly white, no erythema or bulging. ?Neck: normal, supple, no masses, no thyromegaly ?Respiratory: clear to auscultation bilaterally, no wheezing, no crackles. Normal respiratory effort. No accessory muscle use.  ?Cardiovascular: Regular rate and rhythm, no murmurs / rubs / gallops. No extremity edema. 2+ pedal pulses. No carotid bruits.  ?Abdomen: no tenderness, no masses palpated. No hepatosplenomegaly. Bowel sounds positive.  ?Musculoskeletal: no clubbing / cyanosis. No joint deformity upper and lower extremities. Good ROM, no contractures. Normal muscle tone.  ?Skin: no rashes, lesions, ulcers. No induration ?Neurologic: CN 2-12 grossly intact. Sensation intact, DTR normal. Strength 5/5 in all 4.  ?Psychiatric: Normal judgment and insight. Alert and oriented x 3. Normal mood.  ? ? ?Subsequent Medicare wellness visit ?  ?1. Risk factors, based on past  M,S,F  -cardiovascular disease risk factors include age, history of hypertension and hyperlipidemia ?  ?2.  Physical activities: She is very active with walking and pickleball ?  ?3.  Depression/mood: Stable, not depressed ?  ?4.  Hearing: Some issues of the left ?  ?5.  ADL's: Independent in all ADLs ?  ?6.  Fall risk: Low fall risk ?  ?7.  Home safety: No problems identified ?  ?8.  Height weight, and visual acuity: height and weight as above, vision: ? ?Vision Screening  ? Right eye Left eye Both eyes  ?Without correction     ?With correction '20/25 20/25 20/20 '$  ? ?  ?9.  Counseling: Discussed healthy lifestyle changes ?  ?10. Lab orders based on risk factors: Laboratory update will be reviewed ?  ?11. Referral : None today ?  ?12. Care plan: Follow-up with me in 1 year or sooner as needed ?  ?13. Cognitive assessment: No cognitive impairment ?  ?14. Screening: Patient provided with a written and personalized 5-10 year screening schedule in the AVS. yes ?  ?15. Provider List Update: PCP only ? ?16. Advance Directives: Full code ? ? ?17. Opioids: Patient is not  on any opioid prescriptions and has no risk factors for a substance use disorder. ? ? ?[facility] Office Visit from 01/29/2022 in [facility] [place]  ?PHQ-9 Total Score 1  ? ?  ? ? ? ?  09/01/2019  ?  8:58 AM 01/15/2021  ?  2:03 PM 03/28/2021  ?  9:50 AM 01/23/2022  ?  2:28 PM 01/29/2022  ?  8:09 AM  ?Fall Risk  ?Falls in the past year? 0 0  1 0  ?Was there an injury with Fall? 0 0  1 0  ?Fall Risk Category Calculator 0 0  2 0  ?Fall Risk Category Low Low  Moderate Low  ?Patient Fall Risk Level   Low fall risk Low fall risk Low fall risk  ?Patient at Risk for Falls Due to    No Fall Risks No Fall Risks  ?Fall risk Follow up    Falls evaluation completed Falls evaluation completed  ? ? ? ?Impression and Plan: ? ?Encounter for preventive health examination ?-Recommend routine eye and dental care. ?-Immunizations: All immunizations are up-to-date and  age-appropriate ?-Healthy lifestyle discussed in detail. ?-Labs to be updated today. ?-Colon cancer screening: 04/2020 ?-Breast cancer screening: 09/2021 ?-Cervical cancer screening: At age 46, redo age 3 ?-Lung cancer screening: No

## 2022-02-03 ENCOUNTER — Encounter: Payer: Self-pay | Admitting: Internal Medicine

## 2022-02-03 DIAGNOSIS — Z13820 Encounter for screening for osteoporosis: Secondary | ICD-10-CM

## 2022-02-04 NOTE — Telephone Encounter (Signed)
Dexa ordered

## 2022-02-09 ENCOUNTER — Other Ambulatory Visit: Payer: Self-pay | Admitting: Internal Medicine

## 2022-02-09 DIAGNOSIS — R059 Cough, unspecified: Secondary | ICD-10-CM

## 2022-02-09 MED ORDER — BENZONATATE 100 MG PO CAPS: 100.0000 mg | ORAL_CAPSULE | Freq: Three times a day (TID) | ORAL | 0 refills | Status: DC | PRN

## 2022-02-12 ENCOUNTER — Encounter: Payer: Self-pay | Admitting: Internal Medicine

## 2022-02-23 ENCOUNTER — Encounter: Payer: Self-pay | Admitting: Internal Medicine

## 2022-07-28 ENCOUNTER — Encounter: Payer: Self-pay | Admitting: Internal Medicine

## 2022-07-28 DIAGNOSIS — M542 Cervicalgia: Secondary | ICD-10-CM

## 2022-08-11 ENCOUNTER — Encounter: Payer: Self-pay | Admitting: Internal Medicine

## 2022-08-11 ENCOUNTER — Ambulatory Visit (INDEPENDENT_AMBULATORY_CARE_PROVIDER_SITE_OTHER): Payer: Medicare Other | Admitting: Internal Medicine

## 2022-08-11 VITALS — BP 130/80 | HR 60 | Temp 97.8°F | Wt 111.7 lb

## 2022-08-11 DIAGNOSIS — M542 Cervicalgia: Secondary | ICD-10-CM

## 2022-08-11 MED ORDER — CYCLOBENZAPRINE HCL 5 MG PO TABS: 5.0000 mg | ORAL_TABLET | Freq: Three times a day (TID) | ORAL | 1 refills | Status: DC | PRN

## 2022-08-11 MED ORDER — MELOXICAM 7.5 MG PO TABS: 7.5000 mg | ORAL_TABLET | Freq: Every day | ORAL | 0 refills | Status: DC

## 2022-08-11 NOTE — Progress Notes (Signed)
Established Patient Office Visit     CC/Reason for Visit: Left-sided neck pain  HPI: Nicole Snow is a 71 y.o. female who is coming in today for the above mentioned reasons. Shortly after starting a pickleball camp where she was playing for 5 hours a day, she started experiencing significant left-sided neck pain.  She requested referral to physical therapy and has already had a couple sessions.  While pain is improved it is still present.   Past Medical/Surgical History: Past Medical History:  Diagnosis Date   Allergy    C. difficile colitis 08/2016   HTN (hypertension)     Past Surgical History:  Procedure Laterality Date   CHOLECYSTECTOMY     COLONOSCOPY  02/27/2009   T A polyp   HYSTEROSCOPY WITH D & C  1992   LASIK Bilateral    REPLACEMENT TOTAL HIP W/  RESURFACING IMPLANTS     TENDON REPAIR Right 1982   TENDON REPAIR     left hand ring finge at age 75 yr old   TONSILLECTOMY     WISDOM TOOTH EXTRACTION      Social History:  reports that she has never smoked. She has never used smokeless tobacco. She reports current alcohol use of about 7.0 standard drinks of alcohol per week. She reports that she does not use drugs.  Allergies: Allergies  Allergen Reactions   Ciprofloxacin Hives   Flagyl [Metronidazole] Hives   Penicillins     Family History:  Family History  Problem Relation Age of Onset   Dementia Mother    CVA Father 54   COPD Father    Colon cancer Neg Hx    Rectal cancer Neg Hx    Stomach cancer Neg Hx      Current Outpatient Medications:    CALCIUM PO, Take 500 mg by mouth daily., Disp: , Rfl:    cetirizine (ZYRTEC) 10 MG tablet, Take by mouth., Disp: , Rfl:    cyanocobalamin 1000 MCG tablet, Take 1,000 mcg by mouth daily., Disp: , Rfl:    cyclobenzaprine (FLEXERIL) 5 MG tablet, Take 1 tablet (5 mg total) by mouth 3 (three) times daily as needed for muscle spasms., Disp: 30 tablet, Rfl: 1   fluticasone (FLONASE) 50 MCG/ACT  nasal spray, Place 1 spray into both nostrils as needed., Disp: , Rfl:    hydrALAZINE (APRESOLINE) 25 MG tablet, Take 1 tablet (25 mg total) by mouth 2 (two) times daily as needed., Disp: 60 tablet, Rfl: 0   hydrochlorothiazide (HYDRODIURIL) 25 MG tablet, Take 1 tablet (25 mg total) by mouth daily., Disp: 90 tablet, Rfl: 1   magnesium oxide (MAG-OX) 400 MG tablet, Take 400 mg by mouth daily., Disp: , Rfl:    meloxicam (MOBIC) 7.5 MG tablet, Take 1 tablet (7.5 mg total) by mouth daily., Disp: 30 tablet, Rfl: 0   Multiple Vitamins-Minerals (EMERGEN-C IMMUNE PO), Take 1 capsule by mouth daily., Disp: , Rfl:   Review of Systems:  Constitutional: Denies fever, chills, diaphoresis, appetite change and fatigue.  HEENT: Denies photophobia, eye pain, redness, hearing loss, ear pain, congestion, sore throat, rhinorrhea, sneezing, mouth sores, trouble swallowing, neck pain, neck stiffness and tinnitus.   Respiratory: Denies SOB, DOE, cough, chest tightness,  and wheezing.   Cardiovascular: Denies chest pain, palpitations and leg swelling.  Gastrointestinal: Denies nausea, vomiting, abdominal pain, diarrhea, constipation, blood in stool and abdominal distention.  Genitourinary: Denies dysuria, urgency, frequency, hematuria, flank pain and difficulty urinating.  Endocrine: Denies: hot  or cold intolerance, sweats, changes in hair or nails, polyuria, polydipsia. Musculoskeletal: Denies  back pain, joint swelling, arthralgias and gait problem.  Skin: Denies pallor, rash and wound.  Neurological: Denies dizziness, seizures, syncope, weakness, light-headedness, numbness and headaches.  Hematological: Denies adenopathy. Easy bruising, personal or family bleeding history  Psychiatric/Behavioral: Denies suicidal ideation, mood changes, confusion, nervousness, sleep disturbance and agitation    Physical Exam: Vitals:   08/11/22 1559  BP: 130/80  Pulse: 60  Temp: 97.8 F (36.6 C)  TempSrc: Oral  SpO2: 100%   Weight: 111 lb 11.2 oz (50.7 kg)    Body mass index is 16.98 kg/m.   Constitutional: NAD, calm, comfortable Eyes: PERRL, lids and conjunctivae normal, wears corrective lenses ENMT: Mucous membranes are moist.   Psychiatric: Normal judgment and insight. Alert and oriented x 3. Normal mood.    Impression and Plan:  Neck pain on left side - Plan: meloxicam (MOBIC) 7.5 MG tablet, cyclobenzaprine (FLEXERIL) 5 MG tablet  -Continue physical therapy, add icing, local massage therapy.  Will send prescription for meloxicam and Flexeril to take as needed.  Time spent:30 minutes reviewing chart, interviewing and examining patient and formulating plan of care.     Lelon Frohlich, MD [facility] at [facility]

## 2022-08-12 ENCOUNTER — Encounter: Payer: Self-pay | Admitting: Internal Medicine

## 2022-08-12 DIAGNOSIS — M542 Cervicalgia: Secondary | ICD-10-CM

## 2022-08-17 ENCOUNTER — Encounter: Payer: Self-pay | Admitting: Cardiovascular Disease

## 2022-08-17 ENCOUNTER — Ambulatory Visit: Payer: Medicare Other | Attending: Cardiovascular Disease | Admitting: Cardiovascular Disease

## 2022-08-17 VITALS — BP 138/72 | HR 53 | Ht 68.0 in | Wt 112.0 lb

## 2022-08-17 DIAGNOSIS — R072 Precordial pain: Secondary | ICD-10-CM | POA: Diagnosis not present

## 2022-08-17 NOTE — Patient Instructions (Signed)
Medication Instructions:  No changes *If you need a refill on your cardiac medications before your next appointment, please call your pharmacy*   Lab Work: none If you have labs (blood work) drawn today and your tests are completely normal, you will receive your results only by: [place] (if you have MyChart) OR A paper copy in the mail If you have any lab test that is abnormal or we need to change your treatment, we will call you to review the results.   Testing/Procedures: none   Follow-Up: As needed.  Important Information About Sugar

## 2022-08-17 NOTE — Progress Notes (Signed)
Chief Complaint  Patient presents with   Follow-up    Chest pain    History of Present Illness: 71 yo female with history of HTN, hyperlipidemia and arthritis with recent hip replacement who is here today for follow up. I saw her as a new patient in April 2022 for the evaluation of chest wall pain. Her pain starting in the left chest wall after a hard workout. The pain seemed to occur when she was moving her arms or leaning over. This occurred for one second, several times per day. She has no exertional chest pain. No associated dyspnea or diaphoresis. The pain is usually over her left chest. EKG from 12/31/20 in primary care with sinus and T wave inversions in leads V1 and V2, unchanged from EKG in 2021. She is very active. She plays pickle ball 2.5 hours per day with no issues. Her pain was not felt to be cardiac. Echo 02/26/21 with LVEF=60-65%. No valve disease.  She is here today for follow up. The patient denies any chest pain, dyspnea, palpitations, lower extremity edema, orthopnea, PND, dizziness, near syncope or syncope.   Primary Care Physician: Isaac Bliss, Rayford Halsted, MD  Past Medical History:  Diagnosis Date   Allergy    C. difficile colitis 08/2016   HTN (hypertension)     Past Surgical History:  Procedure Laterality Date   CHOLECYSTECTOMY     COLONOSCOPY  02/27/2009   T A polyp   HYSTEROSCOPY WITH D & C  1992   LASIK Bilateral    REPLACEMENT TOTAL HIP W/  RESURFACING IMPLANTS     TENDON REPAIR Right 1982   TENDON REPAIR     left hand ring finge at age 8 yr old   TONSILLECTOMY     WISDOM TOOTH EXTRACTION      Current Outpatient Medications  Medication Sig Dispense Refill   CALCIUM PO Take 500 mg by mouth daily.     cetirizine (ZYRTEC) 10 MG tablet Take by mouth.     cyanocobalamin 1000 MCG tablet Take 1,000 mcg by mouth daily.     cyclobenzaprine (FLEXERIL) 5 MG tablet Take 1 tablet (5 mg total) by mouth 3 (three) times daily as needed for muscle spasms. 30  tablet 1   fluticasone (FLONASE) 50 MCG/ACT nasal spray Place 1 spray into both nostrils as needed.     hydrALAZINE (APRESOLINE) 25 MG tablet Take 1 tablet (25 mg total) by mouth 2 (two) times daily as needed. 60 tablet 0   hydrochlorothiazide (HYDRODIURIL) 25 MG tablet Take 1 tablet (25 mg total) by mouth daily. 90 tablet 1   magnesium oxide (MAG-OX) 400 MG tablet Take 400 mg by mouth daily.     meloxicam (MOBIC) 7.5 MG tablet Take 1 tablet (7.5 mg total) by mouth daily. 30 tablet 0   Multiple Vitamins-Minerals (EMERGEN-C IMMUNE PO) Take 1 capsule by mouth daily.     No current facility-administered medications for this visit.    Allergies  Allergen Reactions   Ciprofloxacin Hives   Flagyl [Metronidazole] Hives   Penicillins     Social History   Socioeconomic History   Marital status: Married    Spouse name: Not on file   Number of children: 1   Years of education: Not on file   Highest education level: Not on file  Occupational History   Occupation: retired-realtor  Tobacco Use   Smoking status: Never   Smokeless tobacco: Never  Vaping Use   Vaping Use: Never  used  Substance and Sexual Activity   Alcohol use: Yes    Alcohol/week: 7.0 standard drinks of alcohol    Types: 7 Glasses of wine per week    Comment: beer/wine   Drug use: Never   Sexual activity: Not on file  Other Topics Concern   Not on file  Social History Narrative   Not on file   Social Determinants of Health   Financial Resource Strain: Not on file  Food Insecurity: Not on file  Transportation Needs: Not on file  Physical Activity: Not on file  Stress: Not on file  Social Connections: Not on file  Intimate Partner Violence: Not on file    Family History  Problem Relation Age of Onset   Dementia Mother    CVA Father 40   COPD Father    Colon cancer Neg Hx    Rectal cancer Neg Hx    Stomach cancer Neg Hx     Review of Systems:  As stated in the HPI and otherwise negative.   BP 138/72    Pulse (!) 53   Ht '5\' 8"'$  (1.727 m)   Wt 112 lb (50.8 kg)   LMP  (LMP Unknown)   SpO2 98%   BMI 17.03 kg/m   Physical Examination: General: Well developed, well nourished, NAD  HEENT: OP clear, mucus membranes moist  SKIN: warm, dry. No rashes. Neuro: No focal deficits  Musculoskeletal: Muscle strength 5/5 all ext  Psychiatric: Mood and affect normal  Neck: No JVD, no carotid bruits, no thyromegaly, no lymphadenopathy.  Lungs:Clear bilaterally, no wheezes, rhonci, crackles Cardiovascular: Regular rate and rhythm. No murmurs, gallops or rubs. Abdomen:Soft. Bowel sounds present. Non-tender.  Extremities: No lower extremity edema. Pulses are 2 + in the bilateral DP/PT.  EKG:  EKG is ordered today. The ekg ordered today demonstrates Sinus brady, rate 53 bpm  Echo May 2022:  1. Left ventricular ejection fraction, by estimation, is 60 to 65%. The  left ventricle has normal function. The left ventricle has no regional  wall motion abnormalities. Left ventricular diastolic parameters were  normal.   2. Right ventricular systolic function is normal. The right ventricular  size is normal.   3. The mitral valve is normal in structure. Mild mitral valve  regurgitation.   4. The aortic valve is tricuspid. Aortic valve regurgitation is not  visualized. Mild aortic valve sclerosis is present, with no evidence of  aortic valve stenosis.   5. The inferior vena cava is normal in size with greater than 50%  respiratory variability, suggesting right atrial pressure of 3 mmHg.  Recent Labs: 01/29/2022: ALT 18; BUN 13; Creatinine, Ser 0.68; Hemoglobin 14.8; Platelets 151.0; Potassium 4.0; Sodium 135   Lipid Panel    Component Value Date/Time   CHOL 215 (H) 01/29/2022 0838   TRIG 65.0 01/29/2022 0838   HDL 88.50 01/29/2022 0838   CHOLHDL 2 01/29/2022 0838   VLDL 13.0 01/29/2022 0838   LDLCALC 113 (H) 01/29/2022 0838     Wt Readings from Last 3 Encounters:  08/17/22 112 lb (50.8 kg)   08/11/22 111 lb 11.2 oz (50.7 kg)  01/29/22 113 lb 11.2 oz (51.6 kg)     Assessment and Plan:   1. Chest pain: No recurrence. Normal LV function by echo in 2022.  No further cardiac workup.  Labs/ tests ordered today include:   Orders Placed This Encounter  Procedures   EKG 12-Lead   Disposition:   F/U with me as needed.  Signed, Lauree Chandler, MD 08/17/2022 4:15 PM    [facility] [place], [place], [place]  [address] Phone: [phone]; Fax: [phone]

## 2022-09-04 ENCOUNTER — Other Ambulatory Visit: Payer: Self-pay | Admitting: Internal Medicine

## 2022-09-04 DIAGNOSIS — Z1231 Encounter for screening mammogram for malignant neoplasm of breast: Secondary | ICD-10-CM

## 2022-09-12 ENCOUNTER — Encounter: Payer: Self-pay | Admitting: Internal Medicine

## 2022-09-12 DIAGNOSIS — M25512 Pain in left shoulder: Secondary | ICD-10-CM

## 2022-09-12 DIAGNOSIS — M542 Cervicalgia: Secondary | ICD-10-CM

## 2022-09-23 ENCOUNTER — Ambulatory Visit (INDEPENDENT_AMBULATORY_CARE_PROVIDER_SITE_OTHER): Payer: Medicare Other

## 2022-09-23 ENCOUNTER — Ambulatory Visit (INDEPENDENT_AMBULATORY_CARE_PROVIDER_SITE_OTHER): Payer: Medicare Other | Admitting: Orthopaedic Surgery

## 2022-09-23 DIAGNOSIS — M542 Cervicalgia: Secondary | ICD-10-CM | POA: Diagnosis not present

## 2022-09-23 DIAGNOSIS — G8929 Other chronic pain: Secondary | ICD-10-CM

## 2022-09-23 DIAGNOSIS — M25512 Pain in left shoulder: Secondary | ICD-10-CM

## 2022-09-23 NOTE — Progress Notes (Signed)
The patient is well-known to me.  She is a 71 year old active female who comes in today with left-sided neck and shoulder pain has been going on since September.  This happened after a pickleball camp.  She hurts with overhead activities but is more in the trapezius area where she hurts.  She has been through physical therapy and even acupuncture treatment.  She has had dry needling.  She denies any radicular symptoms going down her arm but it does radiate into the shoulder in the parascapular area on the left side and into the left side of the clavicle and neck area.  On exam she has excellent range of motion of her left shoulder.  Her shoulder exam the left side is entirely normal.  She has 5 out of 5 strength with a negative liftoff and negative Neer and apprehension signs.  There is no significant pain at the [facility] joint either.  She does have trigger point pain in the trapezius area and parascapular pain.  She has excellent grip and pinch strength and tricep strength.  2 views of the cervical spine show loss of cervical lordosis and significant arthritis in the mid cervical spine.  X-rays of her left shoulder are unremarkable other than some AC joint arthritis.  I did feel that this is more of a cervical issue and a trigger point issue.  I did place a steroid injection in the point of maximum tenderness in the trapezius area from her trigger point.  This is only the left side.  I would like to see her back in 2 weeks to see how she is responding to this and she will continue her therapy.  My next step would be considering an MRI of the cervical spine.       Procedure Note  Patient: Nicole Snow             Date of Birth: 02-27-1951           MRN: [id number]             Visit Date: 09/23/2022  Procedures: Visit Diagnoses:  1. Chronic left shoulder pain   2. Neck pain   3. Trigger point of shoulder region, left     Trigger Point Inj  Date/Time: 09/23/2022 3:39 PM  Performed  by: Mcarthur Rossetti, MD Authorized by: Mcarthur Rossetti, MD   Total # of Trigger Points:  1 Location: shoulder

## 2022-09-28 ENCOUNTER — Encounter: Payer: Self-pay | Admitting: Orthopaedic Surgery

## 2022-09-28 DIAGNOSIS — M542 Cervicalgia: Secondary | ICD-10-CM

## 2022-10-06 ENCOUNTER — Encounter: Payer: Self-pay | Admitting: Sports Medicine

## 2022-10-06 ENCOUNTER — Ambulatory Visit (INDEPENDENT_AMBULATORY_CARE_PROVIDER_SITE_OTHER): Payer: Medicare Other | Admitting: Sports Medicine

## 2022-10-06 VITALS — BP 116/62 | Ht 68.0 in | Wt 115.0 lb

## 2022-10-06 DIAGNOSIS — M542 Cervicalgia: Secondary | ICD-10-CM

## 2022-10-06 MED ORDER — GABAPENTIN 300 MG PO CAPS: 300.0000 mg | ORAL_CAPSULE | Freq: Every day | ORAL | 0 refills | Status: DC

## 2022-10-06 NOTE — Progress Notes (Signed)
   Subjective:    Patient ID: Nicole Snow, female    DOB: November 06, 1950, 71 y.o.   MRN: [id number]  HPI chief complaint: Left-sided neck pain  Patient is a very pleasant active 71 year old right-hand-dominant female that comes in today complaining of left-sided neck pain that she localizes primarily to the trapezius.  This began acutely in October while participating in a pickleball camp.  Upon her return to [facility] she then saw treatment with a physical therapist.  This was not very beneficial so she then saw her PCP who prescribed both muscle relaxers and anti-inflammatories.  Muscle relaxers have been somewhat helpful.  She has also tried dry needling which was not very helpful.  Pain will occasionally radiate to the lateral aspect of the left shoulder but she denies radiating pain down the arm.  She denies numbness or tingling.  Past medical history reviewed Medications reviewed Allergies reviewed    Review of Systems As above    Objective:   Physical Exam  Well-developed, fit appearing.  No acute distress  Cervical spine: Good cervical range of motion.  There is some tenderness to palpation along the left paraspinal musculature as well as into the trapezius.  No spasm.  Good strength.  Reflexes are trace but equal at the biceps, triceps, and brachial radialis tendons bilaterally.  No noticeable atrophy.  Left shoulder: Full painless shoulder range of motion.  Good rotator cuff strength.      Assessment & Plan:   Left-sided neck pain likely referred pain from the cervical spine  [place] would like to try soundwave today.  First treatment was performed as below.  We will also try gabapentin 300 mg nightly and she will follow-up next week for her second treatment.  Of note, she does have an MRI pending of her cervical spine which was ordered by Dr. Ninfa Linden.  Procedure: ECSWT Indications: Left-sided neck pain/trapezius   Procedure Details Consent: Risks of  procedure as well as the alternatives and risks of each were explained to the patient.  Written consent for procedure obtained. Time Out: Verified patient identification, verified procedure, site was marked, verified correct patient position, medications/allergies/relevent history reviewed.  The area was cleaned with alcohol swab.     The left trapezius was targeted for Extracorporeal shockwave therapy.    Preset: Myofascial trigger point Power Level: 60 Frequency: 10 Impulse/cycles: 2000 Head size: Medium   Patient tolerated procedure well without immediate complications   This note was dictated using Dragon naturally speaking software and may contain errors in syntax, spelling, or content which have not been identified prior to signing this note.

## 2022-10-08 ENCOUNTER — Ambulatory Visit (INDEPENDENT_AMBULATORY_CARE_PROVIDER_SITE_OTHER): Payer: Medicare Other | Admitting: Orthopaedic Surgery

## 2022-10-08 DIAGNOSIS — G8929 Other chronic pain: Secondary | ICD-10-CM

## 2022-10-08 DIAGNOSIS — M25512 Pain in left shoulder: Secondary | ICD-10-CM

## 2022-10-08 DIAGNOSIS — M542 Cervicalgia: Secondary | ICD-10-CM

## 2022-10-08 NOTE — Progress Notes (Signed)
The patient comes in today somewhat early.  She is actually scheduled for an MRI of her cervical spine at the end of next week.  When I saw her last we tried a trigger point injection in her left trapezius area.  That is helped somewhat.  She does not truly have shoulder pain but it seems to be trapezius and left-sided neck pain.  Dr. Micheline Chapman has provided some treatments and that has been helpful.  She understands we will see her after her MRI of her cervical spine and can go from there in terms of the treatment plan.

## 2022-10-13 ENCOUNTER — Ambulatory Visit (INDEPENDENT_AMBULATORY_CARE_PROVIDER_SITE_OTHER): Payer: Self-pay | Admitting: Sports Medicine

## 2022-10-13 DIAGNOSIS — M542 Cervicalgia: Secondary | ICD-10-CM

## 2022-10-13 MED ORDER — GABAPENTIN 100 MG PO CAPS: ORAL_CAPSULE | ORAL | 0 refills | Status: DC

## 2022-10-14 NOTE — Progress Notes (Signed)
Patient ID: Primitivo Gauze, female   DOB: 30-Aug-1951, 71 y.o.   MRN: [id number]  Dany presents today for her second soundwave treatment for left-sided neck pain.  She is currently awaiting an MRI which is scheduled for later this week.  She did notice some benefit from the first treatment although she did get a little bit sore afterwards.  Second treatment was performed as below.  She would like to return to the office later this week for her third treatment.  She also states that the 300 mg of gabapentin is a little too strong.  It gives her a drowsy feeling the next day.  She is interested in trying the lower dose 100 mg tablet and she can titrate it up at night as tolerated.  Procedure: ECSWT Indications: Left-sided neck pain/trapezius   Procedure Details Consent: Risks of procedure as well as the alternatives and risks of each were explained to the patient.  Written consent for procedure obtained. Time Out: Verified patient identification, verified procedure, site was marked, verified correct patient position, medications/allergies/relevent history reviewed.  The area was cleaned with alcohol swab.     The left trapezius was targeted for Extracorporeal shockwave therapy.    Preset: Myofascial trigger point Power Level: 60 Frequency: 10 Impulse/cycles: 2000 Head size: Medium   Patient tolerated procedure well without immediate complications   This note was dictated using Dragon naturally speaking software and may contain errors in syntax, spelling, or content which have not been identified prior to signing this note.

## 2022-10-15 ENCOUNTER — Ambulatory Visit (INDEPENDENT_AMBULATORY_CARE_PROVIDER_SITE_OTHER): Payer: Self-pay | Admitting: Sports Medicine

## 2022-10-15 DIAGNOSIS — M542 Cervicalgia: Secondary | ICD-10-CM

## 2022-10-15 NOTE — Progress Notes (Signed)
Patient ID: Nicole Snow, female   DOB: September 21, 1951, 71 y.o.   MRN: [id number]  Nicole Snow presents today for her third soundwave treatment for left-sided neck/trapezius pain.  She has responded very well to the first 2 treatments.  She is also tolerating the 100 mg gabapentin at night much better than the 300 mg gabapentin.  Overall, she is feeling pretty good.  She is still planning on proceeding with her MRI of her cervical spine tomorrow as scheduled.  She will be leaving soon to spend the winter in [place].  Third soundwave treatment performed as below.  We will pause on further treatments for the time being since she is spending the winter in [place].  She will message me with any questions or concerns that she has while she is away.  She may wean to taking her gabapentin at night as needed as symptoms improve.  Procedure: ECSWT Indications: Left-sided neck pain/trapezius   Procedure Details Consent: Risks of procedure as well as the alternatives and risks of each were explained to the patient.  Written consent for procedure obtained. Time Out: Verified patient identification, verified procedure, site was marked, verified correct patient position, medications/allergies/relevent history reviewed.  The area was cleaned with alcohol swab.     The left trapezius was targeted for Extracorporeal shockwave therapy.    Preset: Myofascial trigger point Power Level: 60 Frequency: 10 Impulse/cycles: 2000 Head size: Medium   Patient tolerated procedure well without immediate complications   This note was dictated using Dragon naturally speaking software and may contain errors in syntax, spelling, or content which have not been identified prior to signing this note.

## 2022-10-16 ENCOUNTER — Ambulatory Visit
Admission: RE | Admit: 2022-10-16 | Discharge: 2022-10-16 | Disposition: A | Discharge status: Home / Self Care | Payer: Medicare Other | Source: Ambulatory Visit | Attending: Orthopaedic Surgery | Admitting: Orthopaedic Surgery

## 2022-10-16 DIAGNOSIS — M542 Cervicalgia: Secondary | ICD-10-CM

## 2022-10-20 ENCOUNTER — Encounter: Payer: Self-pay | Admitting: Sports Medicine

## 2022-10-21 ENCOUNTER — Ambulatory Visit (INDEPENDENT_AMBULATORY_CARE_PROVIDER_SITE_OTHER): Payer: Medicare Other | Admitting: Orthopaedic Surgery

## 2022-10-21 ENCOUNTER — Encounter: Payer: Self-pay | Admitting: Orthopaedic Surgery

## 2022-10-21 ENCOUNTER — Ambulatory Visit
Admission: RE | Admit: 2022-10-21 | Discharge: 2022-10-21 | Disposition: A | Discharge status: Home / Self Care | Payer: Medicare Other | Source: Ambulatory Visit | Attending: Internal Medicine | Admitting: Internal Medicine

## 2022-10-21 DIAGNOSIS — M542 Cervicalgia: Secondary | ICD-10-CM | POA: Diagnosis not present

## 2022-10-21 DIAGNOSIS — Z1231 Encounter for screening mammogram for malignant neoplasm of breast: Secondary | ICD-10-CM

## 2022-10-21 DIAGNOSIS — M25512 Pain in left shoulder: Secondary | ICD-10-CM

## 2022-10-21 NOTE — Progress Notes (Signed)
The patient comes in today to go over MRI of her cervical spine.  She is very active and thin 71 year old female.  She has been going [facility] and is also having treatments in her left trapezius area by Dr. Micheline Chapman.  A lot of this has been helpful for her but she still gets a burning type of pain.  She was on Neurontin at a larger dose but had to get under 100 mg of just at nighttime.  We talked in length in detail about Tylenol arthritis and other NSAIDs over-the-counter.  The MRI does show multifactorial stenosis which is severe foraminal stenosis at C4-C5 and C6-C7 more to the left side.  This does correlate with her symptoms.  She still denies any weakness and she is not weak at all.  She does travel to [place] at the end of this week for 3 months.  I talked her about seeking and neck specialist to consider a physiatrist seeing her for left-sided epidural steroid injection at the lower C6-C7 level to the left side.  All questions and concerns were answered and addressed.

## 2022-10-23 ENCOUNTER — Encounter: Payer: Self-pay | Admitting: Internal Medicine

## 2022-10-27 ENCOUNTER — Other Ambulatory Visit: Payer: Self-pay

## 2022-10-27 DIAGNOSIS — M542 Cervicalgia: Secondary | ICD-10-CM

## 2022-10-28 ENCOUNTER — Other Ambulatory Visit: Payer: Self-pay | Admitting: Internal Medicine

## 2022-10-28 ENCOUNTER — Encounter: Payer: Self-pay | Admitting: Sports Medicine

## 2022-10-28 DIAGNOSIS — I10 Essential (primary) hypertension: Secondary | ICD-10-CM

## 2022-10-30 DIAGNOSIS — M5010 Cervical disc disorder with radiculopathy, unspecified cervical region: Secondary | ICD-10-CM | POA: Diagnosis not present

## 2022-10-30 DIAGNOSIS — M542 Cervicalgia: Secondary | ICD-10-CM | POA: Diagnosis not present

## 2022-11-02 DIAGNOSIS — M5010 Cervical disc disorder with radiculopathy, unspecified cervical region: Secondary | ICD-10-CM | POA: Diagnosis not present

## 2022-11-02 DIAGNOSIS — M542 Cervicalgia: Secondary | ICD-10-CM | POA: Diagnosis not present

## 2022-11-04 DIAGNOSIS — M542 Cervicalgia: Secondary | ICD-10-CM | POA: Diagnosis not present

## 2022-11-04 DIAGNOSIS — M5010 Cervical disc disorder with radiculopathy, unspecified cervical region: Secondary | ICD-10-CM | POA: Diagnosis not present

## 2022-11-09 DIAGNOSIS — M542 Cervicalgia: Secondary | ICD-10-CM | POA: Diagnosis not present

## 2022-11-09 DIAGNOSIS — M5010 Cervical disc disorder with radiculopathy, unspecified cervical region: Secondary | ICD-10-CM | POA: Diagnosis not present

## 2022-11-11 DIAGNOSIS — M5010 Cervical disc disorder with radiculopathy, unspecified cervical region: Secondary | ICD-10-CM | POA: Diagnosis not present

## 2022-11-11 DIAGNOSIS — M542 Cervicalgia: Secondary | ICD-10-CM | POA: Diagnosis not present

## 2022-11-16 DIAGNOSIS — M542 Cervicalgia: Secondary | ICD-10-CM | POA: Diagnosis not present

## 2022-11-16 DIAGNOSIS — M5010 Cervical disc disorder with radiculopathy, unspecified cervical region: Secondary | ICD-10-CM | POA: Diagnosis not present

## 2022-11-19 DIAGNOSIS — M542 Cervicalgia: Secondary | ICD-10-CM | POA: Diagnosis not present

## 2022-11-19 DIAGNOSIS — M5010 Cervical disc disorder with radiculopathy, unspecified cervical region: Secondary | ICD-10-CM | POA: Diagnosis not present

## 2022-11-23 ENCOUNTER — Other Ambulatory Visit: Payer: Self-pay

## 2022-11-23 DIAGNOSIS — M542 Cervicalgia: Secondary | ICD-10-CM

## 2022-11-23 DIAGNOSIS — M5010 Cervical disc disorder with radiculopathy, unspecified cervical region: Secondary | ICD-10-CM | POA: Diagnosis not present

## 2022-11-23 NOTE — Progress Notes (Signed)
Pt is currently in [place] requesting PT referrals faxed to 2 locations to try and be seen.

## 2022-12-01 DIAGNOSIS — M4802 Spinal stenosis, cervical region: Secondary | ICD-10-CM | POA: Diagnosis not present

## 2022-12-01 DIAGNOSIS — M50322 Other cervical disc degeneration at C5-C6 level: Secondary | ICD-10-CM | POA: Diagnosis not present

## 2022-12-01 DIAGNOSIS — M47892 Other spondylosis, cervical region: Secondary | ICD-10-CM | POA: Diagnosis not present

## 2022-12-01 DIAGNOSIS — M542 Cervicalgia: Secondary | ICD-10-CM | POA: Diagnosis not present

## 2022-12-02 DIAGNOSIS — M4802 Spinal stenosis, cervical region: Secondary | ICD-10-CM | POA: Diagnosis not present

## 2022-12-02 DIAGNOSIS — M47892 Other spondylosis, cervical region: Secondary | ICD-10-CM | POA: Diagnosis not present

## 2022-12-02 DIAGNOSIS — M542 Cervicalgia: Secondary | ICD-10-CM | POA: Diagnosis not present

## 2022-12-02 DIAGNOSIS — M50322 Other cervical disc degeneration at C5-C6 level: Secondary | ICD-10-CM | POA: Diagnosis not present

## 2022-12-04 DIAGNOSIS — M542 Cervicalgia: Secondary | ICD-10-CM | POA: Diagnosis not present

## 2022-12-04 DIAGNOSIS — M50322 Other cervical disc degeneration at C5-C6 level: Secondary | ICD-10-CM | POA: Diagnosis not present

## 2022-12-04 DIAGNOSIS — M47892 Other spondylosis, cervical region: Secondary | ICD-10-CM | POA: Diagnosis not present

## 2022-12-04 DIAGNOSIS — M4802 Spinal stenosis, cervical region: Secondary | ICD-10-CM | POA: Diagnosis not present

## 2022-12-07 DIAGNOSIS — J029 Acute pharyngitis, unspecified: Secondary | ICD-10-CM | POA: Diagnosis not present

## 2022-12-07 DIAGNOSIS — R059 Cough, unspecified: Secondary | ICD-10-CM | POA: Diagnosis not present

## 2022-12-07 DIAGNOSIS — M47892 Other spondylosis, cervical region: Secondary | ICD-10-CM | POA: Diagnosis not present

## 2022-12-07 DIAGNOSIS — J3489 Other specified disorders of nose and nasal sinuses: Secondary | ICD-10-CM | POA: Diagnosis not present

## 2022-12-07 DIAGNOSIS — M50322 Other cervical disc degeneration at C5-C6 level: Secondary | ICD-10-CM | POA: Diagnosis not present

## 2022-12-07 DIAGNOSIS — M542 Cervicalgia: Secondary | ICD-10-CM | POA: Diagnosis not present

## 2022-12-07 DIAGNOSIS — M4802 Spinal stenosis, cervical region: Secondary | ICD-10-CM | POA: Diagnosis not present

## 2022-12-07 DIAGNOSIS — I10 Essential (primary) hypertension: Secondary | ICD-10-CM | POA: Diagnosis not present

## 2022-12-09 DIAGNOSIS — M50322 Other cervical disc degeneration at C5-C6 level: Secondary | ICD-10-CM | POA: Diagnosis not present

## 2022-12-09 DIAGNOSIS — M4802 Spinal stenosis, cervical region: Secondary | ICD-10-CM | POA: Diagnosis not present

## 2022-12-09 DIAGNOSIS — M47892 Other spondylosis, cervical region: Secondary | ICD-10-CM | POA: Diagnosis not present

## 2022-12-09 DIAGNOSIS — M542 Cervicalgia: Secondary | ICD-10-CM | POA: Diagnosis not present

## 2022-12-11 DIAGNOSIS — M50322 Other cervical disc degeneration at C5-C6 level: Secondary | ICD-10-CM | POA: Diagnosis not present

## 2022-12-11 DIAGNOSIS — M542 Cervicalgia: Secondary | ICD-10-CM | POA: Diagnosis not present

## 2022-12-11 DIAGNOSIS — M4802 Spinal stenosis, cervical region: Secondary | ICD-10-CM | POA: Diagnosis not present

## 2022-12-11 DIAGNOSIS — M47892 Other spondylosis, cervical region: Secondary | ICD-10-CM | POA: Diagnosis not present

## 2023-01-10 DIAGNOSIS — Z880 Allergy status to penicillin: Secondary | ICD-10-CM | POA: Diagnosis not present

## 2023-01-10 DIAGNOSIS — J0120 Acute ethmoidal sinusitis, unspecified: Secondary | ICD-10-CM | POA: Diagnosis not present

## 2023-01-10 DIAGNOSIS — J208 Acute bronchitis due to other specified organisms: Secondary | ICD-10-CM | POA: Diagnosis not present

## 2023-01-10 DIAGNOSIS — B9689 Other specified bacterial agents as the cause of diseases classified elsewhere: Secondary | ICD-10-CM | POA: Diagnosis not present

## 2023-01-10 DIAGNOSIS — J00 Acute nasopharyngitis [common cold]: Secondary | ICD-10-CM | POA: Diagnosis not present

## 2023-01-10 DIAGNOSIS — J029 Acute pharyngitis, unspecified: Secondary | ICD-10-CM | POA: Diagnosis not present

## 2023-01-10 DIAGNOSIS — R0982 Postnasal drip: Secondary | ICD-10-CM | POA: Diagnosis not present

## 2023-01-10 DIAGNOSIS — R059 Cough, unspecified: Secondary | ICD-10-CM | POA: Diagnosis not present

## 2023-01-10 DIAGNOSIS — Z79899 Other long term (current) drug therapy: Secondary | ICD-10-CM | POA: Diagnosis not present

## 2023-01-27 ENCOUNTER — Encounter: Payer: Medicare Other | Admitting: Obstetrics & Gynecology

## 2023-01-27 ENCOUNTER — Encounter: Payer: Self-pay | Admitting: Internal Medicine

## 2023-01-27 DIAGNOSIS — L821 Other seborrheic keratosis: Secondary | ICD-10-CM | POA: Diagnosis not present

## 2023-01-27 DIAGNOSIS — I10 Essential (primary) hypertension: Secondary | ICD-10-CM

## 2023-01-27 DIAGNOSIS — L538 Other specified erythematous conditions: Secondary | ICD-10-CM | POA: Diagnosis not present

## 2023-01-27 DIAGNOSIS — L570 Actinic keratosis: Secondary | ICD-10-CM | POA: Diagnosis not present

## 2023-01-27 DIAGNOSIS — R208 Other disturbances of skin sensation: Secondary | ICD-10-CM | POA: Diagnosis not present

## 2023-01-27 DIAGNOSIS — L298 Other pruritus: Secondary | ICD-10-CM | POA: Diagnosis not present

## 2023-01-27 DIAGNOSIS — L814 Other melanin hyperpigmentation: Secondary | ICD-10-CM | POA: Diagnosis not present

## 2023-01-27 DIAGNOSIS — D225 Melanocytic nevi of trunk: Secondary | ICD-10-CM | POA: Diagnosis not present

## 2023-01-27 DIAGNOSIS — L820 Inflamed seborrheic keratosis: Secondary | ICD-10-CM | POA: Diagnosis not present

## 2023-01-27 DIAGNOSIS — H61031 Chondritis of right external ear: Secondary | ICD-10-CM | POA: Diagnosis not present

## 2023-02-10 ENCOUNTER — Ambulatory Visit (INDEPENDENT_AMBULATORY_CARE_PROVIDER_SITE_OTHER): Payer: Medicare HMO | Admitting: Obstetrics & Gynecology

## 2023-02-10 ENCOUNTER — Telehealth: Payer: Self-pay | Admitting: *Deleted

## 2023-02-10 ENCOUNTER — Other Ambulatory Visit (HOSPITAL_COMMUNITY)
Admission: RE | Admit: 2023-02-10 | Discharge: 2023-02-10 | Disposition: A | Discharge status: Home / Self Care | Payer: Medicare HMO | Source: Ambulatory Visit | Attending: Obstetrics & Gynecology | Admitting: Obstetrics & Gynecology

## 2023-02-10 ENCOUNTER — Encounter: Payer: Self-pay | Admitting: Obstetrics & Gynecology

## 2023-02-10 VITALS — BP 104/70 | HR 65 | Ht 67.25 in | Wt 113.0 lb

## 2023-02-10 DIAGNOSIS — Z01419 Encounter for gynecological examination (general) (routine) without abnormal findings: Secondary | ICD-10-CM | POA: Insufficient documentation

## 2023-02-10 DIAGNOSIS — M816 Localized osteoporosis [Lequesne]: Secondary | ICD-10-CM | POA: Diagnosis not present

## 2023-02-10 DIAGNOSIS — R21 Rash and other nonspecific skin eruption: Secondary | ICD-10-CM | POA: Diagnosis not present

## 2023-02-10 DIAGNOSIS — M542 Cervicalgia: Secondary | ICD-10-CM | POA: Diagnosis not present

## 2023-02-10 DIAGNOSIS — Z780 Asymptomatic menopausal state: Secondary | ICD-10-CM | POA: Diagnosis not present

## 2023-02-10 MED ORDER — HYDROCHLOROTHIAZIDE 25 MG PO TABS
25.0000 mg | ORAL_TABLET | Freq: Every day | ORAL | 1 refills | Status: DC
Start: 2023-02-10 — End: 2023-05-31

## 2023-02-10 MED ORDER — NYSTATIN-TRIAMCINOLONE 100000-0.1 UNIT/GM-% EX OINT: 1.0000 | TOPICAL_OINTMENT | Freq: Two times a day (BID) | CUTANEOUS | 0 refills | Status: AC

## 2023-02-10 NOTE — Telephone Encounter (Signed)
-----   Message from Genia Del, MD sent at 02/10/2023 10:47 AM EDT ----- Regarding: [facility] Osteoporosis on BD in 12/2020 at the Distal 1/3 Radius -3.1.

## 2023-02-10 NOTE — Telephone Encounter (Signed)
Call placed to [facility], spoke with [facility].  Patient scheduled for BMD on 08/20/23 at 1530.  Hold Multivitamin and calcium 48 hrs prior to appt.    Spoke with patient, states she will be out of town during this time, request to schedule mid Nov 2024 or later. Patient agreeable to MyChart message with appt details once rescheduled.    Spoke with Ada at [facility], BMD rescheduled to 09/10/23 at 1030.  Patient notified by MyChart message.   Routing to provider for final review. Patient is agreeable to disposition. Will close encounter.

## 2023-02-10 NOTE — Progress Notes (Signed)
Nicole Snow 12/07/1950 [id number]   History:    72 y.o. G2P1A1L1 Married  RP:  New patient presenting for annual gyn exam   HPI: Postmenopause, well on no HRT.  No PMB.  No pelvic pain. Pt c/o a rash on the left vulva that is itching & irritated. Abstinent x 1 year. PAP: 53yrs ago normal per patient.  Pap reflex today.  Breasts normal.  MMG Neg on 10-21-22.  Osteoporosis on BD 12/2020 at the Distal 1/3 Radius T-Score -3.1.  [facility]. BMI 17.57.  Very fit, plays pickle ball. Colono in 2021. Health labs with Fam MD.  Past medical history,surgical history, family history and social history were all reviewed and documented in the EPIC chart.  Gynecologic History No LMP recorded (lmp unknown). Patient is postmenopausal.  Obstetric History OB History  Gravida Para Term Preterm AB Living  SAB IAB Ectopic Multiple Live Births  1       1    # Outcome Date GA Lbr Len/2nd Weight Sex Delivery Anes PTL Lv  2 SAB           1 Term              ROS: A ROS was performed and pertinent positives and negatives are included in the history. GENERAL: No fevers or chills. HEENT: No change in vision, no earache, sore throat or sinus congestion. NECK: No pain or stiffness. CARDIOVASCULAR: No chest pain or pressure. No palpitations. PULMONARY: No shortness of breath, cough or wheeze. GASTROINTESTINAL: No abdominal pain, nausea, vomiting or diarrhea, melena or bright red blood per rectum. GENITOURINARY: No urinary frequency, urgency, hesitancy or dysuria. MUSCULOSKELETAL: No joint or muscle pain, no back pain, no recent trauma. DERMATOLOGIC: No rash, no itching, no lesions. ENDOCRINE: No polyuria, polydipsia, no heat or cold intolerance. No recent change in weight. HEMATOLOGICAL: No anemia or easy bruising or bleeding. NEUROLOGIC: No headache, seizures, numbness, tingling or weakness. PSYCHIATRIC: No depression, no loss of interest in normal activity or change  in sleep pattern.     Exam:   BP 104/70   Pulse 65   Ht 5' 7.25" (1.708 m)   Wt 113 lb (51.3 kg)   LMP  (LMP Unknown) Comment: not sexually active  SpO2 99%   BMI 17.57 kg/m   Body mass index is 17.57 kg/m.  General appearance : Well developed well nourished female. No acute distress HEENT: Eyes: no retinal hemorrhage or exudates,  Neck supple, trachea midline, no carotid bruits, no thyroidmegaly Lungs: Clear to auscultation, no rhonchi or wheezes, or rib retractions  Heart: Regular rate and rhythm, no murmurs or gallops Breast:Examined in sitting and supine position were symmetrical in appearance, no palpable masses or tenderness,  no skin retraction, no nipple inversion, no nipple discharge, no skin discoloration, no axillary or supraclavicular lymphadenopathy Abdomen: no palpable masses or tenderness, no rebound or guarding Extremities: no edema or skin discoloration or tenderness  Pelvic: Vulva: Left vulvar irritation of many sebaceous glands.             Vagina: No gross lesions or discharge  Cervix: No gross lesions or discharge.  Pap reflex done.  Uterus  AV, normal size, shape and consistency, non-tender and mobile  Adnexa  Without masses or tenderness  Anus: Normal   Assessment/Plan:  72 y.o. female for annual exam   1. Encounter for gynecological examination with Papanicolaou smear of cervix  Postmenopause, well on no HRT.  No PMB.  No pelvic pain. Pt c/o a rash on the left vulva that is itching & irritated. Abstinent x 1 year. PAP: 90yrs ago normal per patient.  Pap reflex today.  Breasts normal.  MMG Neg on 10-21-22.  Osteoporosis on BD 12/2020 at the Distal 1/3 Radius T-Score -3.1.  [facility]. BMI 17.57.  Very fit, plays pickle ball. Colono in 2021. Health labs with Fam MD. - Cytology - PAP( [place])  2. Postmenopause Postmenopause, well on no HRT.  No PMB.  No pelvic pain.   3. Localized osteoporosis without current pathological  fracture Osteoporosis on BD 12/2020 at the Distal 1/3 Radius T-Score -3.1.  [facility]. BMI 17.57.  Very fit, plays pickle ball. - DG Bone Density; Future  4. Vulvar rash Patient c/o a rash on the left vulva that is itching & irritated. Vulva: Left vulvar irritation of many sebaceous glands.  Will treat with Mycolog ointment, prescription sent.  Other orders - fexofenadine (ALLEGRA) 180 MG tablet; Take 180 mg by mouth daily. - Multiple Vitamin (MULTIVITAMIN PO); Take by mouth. - Cholecalciferol (VITAMIN D3 PO); Take by mouth. - nystatin-triamcinolone ointment (MYCOLOG); Apply 1 Application topically 2 (two) times daily for 14 days.   Genia Del MD, 10:15 AM

## 2023-02-12 LAB — CYTOLOGY - PAP: Diagnosis: NEGATIVE

## 2023-02-16 ENCOUNTER — Encounter: Payer: Self-pay | Admitting: Internal Medicine

## 2023-02-17 ENCOUNTER — Ambulatory Visit: Payer: Medicare Other | Admitting: Podiatry

## 2023-02-24 DIAGNOSIS — H6122 Impacted cerumen, left ear: Secondary | ICD-10-CM | POA: Diagnosis not present

## 2023-02-26 ENCOUNTER — Ambulatory Visit (INDEPENDENT_AMBULATORY_CARE_PROVIDER_SITE_OTHER): Payer: Medicare HMO | Admitting: Podiatry

## 2023-02-26 ENCOUNTER — Ambulatory Visit: Payer: 59

## 2023-02-26 ENCOUNTER — Encounter: Payer: Self-pay | Admitting: Podiatry

## 2023-02-26 DIAGNOSIS — L600 Ingrowing nail: Secondary | ICD-10-CM

## 2023-02-26 DIAGNOSIS — M79675 Pain in left toe(s): Secondary | ICD-10-CM

## 2023-02-26 NOTE — Patient Instructions (Signed)

## 2023-02-28 ENCOUNTER — Encounter: Payer: Self-pay | Admitting: Podiatry

## 2023-03-01 NOTE — Progress Notes (Signed)
Subjective:   Patient ID: Nicole Snow, female   DOB: 72 y.o.   MRN: [id number]   HPI Patient states she has a very painful ingrown toenail left big toe and it has been bothering her a lot   ROS      Objective:  Physical Exam  Neurovascular status intact muscle strength found to be adequate range of motion adequate with incurvated medial border left hallux painful when pressed localized no active drainage or redness noted     Assessment:  Ingrown toenail deformity left hallux medial border with pain     Plan:  H&P reviewed recommended correction of deformity explained procedure risk patient wants to have this faxed and at this point I allowed her to read then signed consent form after review.  I anesthetized the left big toe 60 mg like Marcaine mixture sterile prep done using sterile instrumentation remove the border exposed matrix applied phenol 3 applications 30 seconds followed by alcohol lavage sterile dressing gave instructions on soaks wear dressing 24 hours take it off earlier if throbbing were to occur and encouraged to call questions concerns which may arise

## 2023-03-02 ENCOUNTER — Ambulatory Visit (INDEPENDENT_AMBULATORY_CARE_PROVIDER_SITE_OTHER): Payer: Self-pay | Admitting: Sports Medicine

## 2023-03-02 VITALS — Ht 67.5 in

## 2023-03-02 DIAGNOSIS — M542 Cervicalgia: Secondary | ICD-10-CM

## 2023-03-02 NOTE — Progress Notes (Signed)
Patient ID: Oletta Darter, female   DOB: 01-Oct-1951, 72 y.o.   MRN: [id number]  Rhianne presents today for another soundwave treatment for her left trapezius.  She has recently returned from [place].  She was able to do extensive physical therapy while she was down there and she found it to be extremely helpful.  Only symptom that she is currently experiencing is some discomfort in the left trapezius area.  No radiating pain into her arms.  She remains physically active.  Soundwave treatment performed as below.  She tolerates this without difficulty.  She is doing so well at this standpoint that we have decided to have her follow-up on an as-needed basis.  Procedure: ECSWT Indications: Left-sided neck pain/trapezius   Procedure Details Consent: Risks of procedure as well as the alternatives and risks of each were explained to the patient.  Written consent for procedure obtained. Time Out: Verified patient identification, verified procedure, site was marked, verified correct patient position, medications/allergies/relevent history reviewed.  The area was cleaned with alcohol swab.     The left trapezius was targeted for Extracorporeal shockwave therapy.    Preset: Myofascial trigger point Power Level: 60 Frequency: 10 Impulse/cycles: 2000 Head size: Medium   Patient tolerated procedure well without immediate complications    This note was dictated using Dragon naturally speaking software and may contain errors in syntax, spelling, or content which have not been identified prior to signing this note.

## 2023-03-09 DIAGNOSIS — Z0100 Encounter for examination of eyes and vision without abnormal findings: Secondary | ICD-10-CM | POA: Diagnosis not present

## 2023-03-09 DIAGNOSIS — H2513 Age-related nuclear cataract, bilateral: Secondary | ICD-10-CM | POA: Diagnosis not present

## 2023-03-09 DIAGNOSIS — H43812 Vitreous degeneration, left eye: Secondary | ICD-10-CM | POA: Diagnosis not present

## 2023-03-22 ENCOUNTER — Encounter: Payer: Self-pay | Admitting: Sports Medicine

## 2023-03-23 ENCOUNTER — Ambulatory Visit (INDEPENDENT_AMBULATORY_CARE_PROVIDER_SITE_OTHER): Payer: Self-pay | Admitting: Sports Medicine

## 2023-03-23 DIAGNOSIS — M542 Cervicalgia: Secondary | ICD-10-CM

## 2023-03-24 NOTE — Progress Notes (Signed)
Patient ID: Nicole Snow, female   DOB: [birth year], 72 y.o.   MRN: [id number]  Nicole Snow presents today requesting another soundwave treatment for her left trapezius.  This will be her fourth treatment overall.  Her pain has begun to return.  It is worsened to the point that she has had to resume some limited gabapentin.  She states that she has been playing a little less pickleball than usual but has increased her weight training, specifically increasing the amount of weight that she is lifting.  Soundwave treatment was performed as below.  She tolerated it without difficulty.  I did recommend that she reduce the amount of weight that she is working out with and monitor which exercises seem to bother her neck.  She will let me know if she needs a refill on her gabapentin.  She will follow-up as needed.  Procedure: ECSWT Indications: Left-sided neck pain/trapezius   Procedure Details Consent: Risks of procedure as well as the alternatives and risks of each were explained to the patient.  Written consent for procedure obtained. Time Out: Verified patient identification, verified procedure, site was marked, verified correct patient position, medications/allergies/relevent history reviewed.  The area was cleaned with alcohol swab.     The left trapezius was targeted for Extracorporeal shockwave therapy.    Preset: Myofascial trigger point Power Level: 60 Frequency: 10 Impulse/cycles: 2000 Head size: Medium   Patient tolerated procedure well without immediate complications    This note was dictated using Dragon naturally speaking software and may contain errors in syntax, spelling, or content which have not been identified prior to signing this note.

## 2023-03-25 ENCOUNTER — Encounter: Payer: Self-pay | Admitting: Orthopaedic Surgery

## 2023-03-25 DIAGNOSIS — M542 Cervicalgia: Secondary | ICD-10-CM

## 2023-04-05 ENCOUNTER — Ambulatory Visit (INDEPENDENT_AMBULATORY_CARE_PROVIDER_SITE_OTHER): Payer: Medicare HMO | Admitting: Orthopedic Surgery

## 2023-04-05 ENCOUNTER — Other Ambulatory Visit (INDEPENDENT_AMBULATORY_CARE_PROVIDER_SITE_OTHER): Payer: Medicare HMO

## 2023-04-05 ENCOUNTER — Encounter: Payer: Self-pay | Admitting: Orthopedic Surgery

## 2023-04-05 VITALS — BP 156/84 | HR 62 | Ht 67.5 in | Wt 113.0 lb

## 2023-04-05 DIAGNOSIS — M542 Cervicalgia: Secondary | ICD-10-CM | POA: Diagnosis not present

## 2023-04-05 NOTE — Progress Notes (Signed)
Orthopedic Spine Surgery Office Note  Assessment: Patient is a 72 y.o. female with neck pain that radiates into the left trapezius and shoulder.  Has left-sided foraminal stenosis at C3/4, C4/5, C5/6 causing radiculopathy   Plan: -Explained that initially conservative treatment is tried as a significant number of patients may experience relief with these treatment modalities. Discussed that the conservative treatments include:  -activity modification  -physical therapy  -over the counter pain medications  -medrol dosepak  -cervical steroid injections -Patient has tried trigger point injections, extracorporeal shockwave treatment, gabapentin, PT, intramuscular steroid injection, Tylenol, Advil -Recommended Tylenol and traction -Discussed cervical epidural injection as a next treatment option -Encouraged her to gain weight to get up to a BMI of 18.5. Gave her a weight goal of 120lbs.  Would want her to get her osteoporosis treated before surgical intervention -Patient is out of town throughout the summer, so she will call the office to let me know if she wants to try the injection   Patient expressed understanding of the plan and all questions were answered to the patient's satisfaction.   ___________________________________________________________________________   History:  Patient is a 72 y.o. female who presents today for cervical spine.  Patient has had pain in her neck radiating into the left trapezius and shoulder since September 2023.  She has tried multiple conservative treatments since onset.  Pain is felt on a daily basis.  She feels that with activity and at rest.  It does not radiate past the left shoulder.  She has no pain in the right upper extremity.   Weakness: Denies Difficulty with fine motor skills (e.g., buttoning shirts, handwriting): Denies Symptoms of imbalance: Denies Paresthesias and numbness: Denies Bowel or bladder incontinence: Denies Saddle anesthesia:  Denies  Treatments tried: trigger point injections, extracorporeal shockwave treatment, gabapentin, PT, intramuscular steroid injection, Tylenol, Advil  Review of systems: Denies fevers and chills, night sweats, unexplained weight loss, history of cancer.  Has had pain that wakes her at night  Past medical history: HTN Osteoporosis  Allergies: ciprofloxacin, flagyl, penicillin  Past surgical history:  Wisdom tooth extraction Uterus D&C Bilateral THA Cholecystectomy Tonsillectomy  Social history: Denies use of nicotine product (smoking, vaping, patches, smokeless) Alcohol use: Yes, approximately 6 drinks per week Denies recreational drug use  Physical Exam:  BMI of 17.4  General: no acute distress, appears stated age Neurologic: alert, answering questions appropriately, following commands Respiratory: unlabored breathing on room air, symmetric chest rise Psychiatric: appropriate affect, normal cadence to speech   MSK (spine):  -Strength exam      Left  Right Grip strength                5/5  5/5 Interosseus   5/5   5/5 Wrist extension  5/5  5/5 Wrist flexion   5/5  5/5 Elbow flexion   5/5  5/5 Deltoid    5/5  5/5  EHL    5/5  5/5 TA    5/5  5/5 GSC    5/5  5/5 Knee extension  5/5  5/5 Hip flexion   5/5  5/5  -Sensory exam    Sensation intact to light touch in L3-S1 nerve distributions of bilateral lower extremities  Sensation intact to light touch in C5-T1 nerve distributions of bilateral upper extremities  -Brachioradialis DTR: 2/4 on the left, 2/4 on the right -Biceps DTR: 2/4 on the left, 2/4 on the right -Achilles DTR: 2/4 on the left, 2/4 on the right -Patellar tendon DTR: 2/4 on  the left, 2/4 on the right  -Spurling: Negative bilaterally -Hoffman sign: Negative bilaterally -Clonus: No beats bilaterally -Interosseous wasting: None seen -Grip and release test: Negative -Romberg: Negative -Gait: Normal  Left shoulder exam: No pain through range  of motion, negative Jobe, no weakness with external rotation with arm at side, negative belly press Right shoulder exam: No pain through range of motion   Imaging: XR of the cervical spine from 04/05/2023 was independently reviewed and interpreted, showing disc height loss at C3/4, C4/5, C5/6. Anterior osteophyte formation at C3/4 and C5/6. No evidence of instability on flexion/extension views. No fracture or dislocation seen.   MRI of the cervical spine from 10/16/2022 was independently reviewed and interpreted, showing left sided foraminal stenosis at C3/4 and C4/5. Central and bilateral foraminal stenosis at C5/6. No T2 cord signal change.    Patient name: Nicole Snow Patient MRN: [id number] Date of visit: 04/05/23

## 2023-04-06 ENCOUNTER — Encounter: Payer: Self-pay | Admitting: Internal Medicine

## 2023-04-06 ENCOUNTER — Telehealth: Payer: Self-pay

## 2023-04-06 ENCOUNTER — Ambulatory Visit (INDEPENDENT_AMBULATORY_CARE_PROVIDER_SITE_OTHER): Payer: Medicare HMO | Admitting: Internal Medicine

## 2023-04-06 VITALS — BP 102/68 | HR 59 | Temp 97.9°F | Ht 68.0 in | Wt 114.2 lb

## 2023-04-06 DIAGNOSIS — I10 Essential (primary) hypertension: Secondary | ICD-10-CM

## 2023-04-06 DIAGNOSIS — Z13820 Encounter for screening for osteoporosis: Secondary | ICD-10-CM

## 2023-04-06 DIAGNOSIS — E782 Mixed hyperlipidemia: Secondary | ICD-10-CM | POA: Diagnosis not present

## 2023-04-06 DIAGNOSIS — M810 Age-related osteoporosis without current pathological fracture: Secondary | ICD-10-CM | POA: Diagnosis not present

## 2023-04-06 DIAGNOSIS — Z0000 Encounter for general adult medical examination without abnormal findings: Secondary | ICD-10-CM | POA: Diagnosis not present

## 2023-04-06 LAB — CBC WITH DIFFERENTIAL/PLATELET
Basophils Absolute: 0.1 10*3/uL (ref 0.0–0.1)
Basophils Relative: 0.6 % (ref 0.0–3.0)
Eosinophils Absolute: 0.1 10*3/uL (ref 0.0–0.7)
Eosinophils Relative: 0.7 % (ref 0.0–5.0)
HCT: 43.7 % (ref 36.0–46.0)
Hemoglobin: 14.6 g/dL (ref 12.0–15.0)
Lymphocytes Relative: 17.7 % (ref 12.0–46.0)
Lymphs Abs: 1.5 10*3/uL (ref 0.7–4.0)
MCHC: 33.3 g/dL (ref 30.0–36.0)
MCV: 95.6 fl (ref 78.0–100.0)
Monocytes Absolute: 0.5 10*3/uL (ref 0.1–1.0)
Monocytes Relative: 6.1 % (ref 3.0–12.0)
Neutro Abs: 6.3 10*3/uL (ref 1.4–7.7)
Neutrophils Relative %: 74.9 % (ref 43.0–77.0)
Platelets: 163 10*3/uL (ref 150.0–400.0)
RBC: 4.57 Mil/uL (ref 3.87–5.11)
RDW: 12.5 % (ref 11.5–15.5)
WBC: 8.4 10*3/uL (ref 4.0–10.5)

## 2023-04-06 LAB — COMPREHENSIVE METABOLIC PANEL
ALT: 15 U/L (ref 0–35)
AST: 21 U/L (ref 0–37)
Albumin: 4.2 g/dL (ref 3.5–5.2)
Alkaline Phosphatase: 64 U/L (ref 39–117)
BUN: 16 mg/dL (ref 6–23)
CO2: 28 mEq/L (ref 19–32)
Calcium: 9.6 mg/dL (ref 8.4–10.5)
Chloride: 96 mEq/L (ref 96–112)
Creatinine, Ser: 0.74 mg/dL (ref 0.40–1.20)
GFR: 81.91 mL/min (ref >60.00)
Glucose, Bld: 91 mg/dL (ref 70–99)
Potassium: 3.7 mEq/L (ref 3.5–5.1)
Sodium: 130 mEq/L — ABNORMAL LOW (ref 135–145)
Total Bilirubin: 1.3 mg/dL — ABNORMAL HIGH (ref 0.2–1.2)
Total Protein: 6.5 g/dL (ref 6.0–8.3)

## 2023-04-06 LAB — LIPID PANEL
Cholesterol: 218 mg/dL — ABNORMAL HIGH (ref 0–200)
HDL: 96.3 mg/dL (ref >39.00)
LDL Cholesterol: 103 mg/dL — ABNORMAL HIGH (ref 0–99)
NonHDL: 121.28
Total CHOL/HDL Ratio: 2
Triglycerides: 93 mg/dL (ref 0.0–149.0)
VLDL: 18.6 mg/dL (ref 0.0–40.0)

## 2023-04-06 LAB — VITAMIN D 25 HYDROXY (VIT D DEFICIENCY, FRACTURES): VITD: 58.3 ng/mL (ref 30.00–100.00)

## 2023-04-06 LAB — VITAMIN B12: Vitamin B-12: 690 pg/mL (ref 211–911)

## 2023-04-06 LAB — HEMOGLOBIN A1C: Hgb A1c MFr Bld: 5.1 % (ref 4.6–6.5)

## 2023-04-06 LAB — TSH: TSH: 0.7 u[IU]/mL (ref 0.35–5.50)

## 2023-04-06 NOTE — Telephone Encounter (Signed)
Pt ready for scheduling whenever.  Out-of-pocket cost due at time of visit: $315  Primary: Medicare Prolia co-insurance: 20% (approximately $315)  Deductible: No  Prior Auth: Pending PA# Valid:   ** This summary of benefits is an estimation of the patient's out-of-pocket cost. Exact cost may vary based on individual plan coverage.

## 2023-04-06 NOTE — Progress Notes (Signed)
Established Patient Office Visit     CC/Reason for Visit: Annual preventive exam and subsequent Medicare wellness visit  HPI: Nicole Snow is a 72 y.o. female who is coming in today for the above mentioned reasons. Past Medical History is significant for: Hypertension, hyperlipidemia, osteoporosis.  She has routine eye and dental care.  She is due for an updated bone density.  She has been recently dealing with neck pain and was found to have cervical stenosis.  She was advised by her orthopedist to start treatment for osteoporosis which she had declined in the past.  All immunizations are up-to-date.  Cancer screening is up-to-date.   Past Medical/Surgical History: Past Medical History:  Diagnosis Date   Allergy    C. difficile colitis 08/2016   HTN (hypertension)     Past Surgical History:  Procedure Laterality Date   CHOLECYSTECTOMY     COLONOSCOPY  02/27/2009   T A polyp   HYSTEROSCOPY WITH D & C  1992   KNEE ARTHROSCOPY     LASIK Bilateral    REPLACEMENT TOTAL HIP W/  RESURFACING IMPLANTS Bilateral    TENDON REPAIR Right 1982   TENDON REPAIR     left hand ring finge at age 58 yr old   TONSILLECTOMY     WISDOM TOOTH EXTRACTION      Social History:  reports that she has never smoked. She has never used smokeless tobacco. She reports current alcohol use of about 7.0 standard drinks of alcohol per week. She reports that she does not use drugs.  Allergies: Allergies  Allergen Reactions   Ciprofloxacin Hives   Flagyl [Metronidazole] Hives   Penicillins     Family History:  Family History  Problem Relation Age of Onset   Hypertension Mother    Dementia Mother    Stroke Father 61   Hypertension Father    Diabetes Father    COPD Father    Cancer Maternal Grandmother        female cancer   Heart attack Maternal Grandfather      Current Outpatient Medications:    CALCIUM PO, Take 500 mg by mouth daily., Disp: , Rfl:    cetirizine (ZYRTEC) 10  MG tablet, Take by mouth., Disp: , Rfl:    Cholecalciferol (VITAMIN D3 PO), Take by mouth., Disp: , Rfl:    cyanocobalamin 1000 MCG tablet, Take 1,000 mcg by mouth., Disp: , Rfl:    fexofenadine (ALLEGRA) 180 MG tablet, Take 180 mg by mouth daily., Disp: , Rfl:    fluticasone (FLONASE) 50 MCG/ACT nasal spray, Place 1 spray into both nostrils as needed., Disp: , Rfl:    gabapentin (NEURONTIN) 100 MG capsule, Take 100 mg by mouth 3 (three) times daily., Disp: , Rfl:    hydrALAZINE (APRESOLINE) 25 MG tablet, Take 1 tablet (25 mg total) by mouth 2 (two) times daily as needed., Disp: 60 tablet, Rfl: 0   hydrochlorothiazide (HYDRODIURIL) 25 MG tablet, Take 1 tablet (25 mg total) by mouth daily., Disp: 90 tablet, Rfl: 1   magnesium oxide (MAG-OX) 400 MG tablet, Take 400 mg by mouth daily., Disp: , Rfl:    Multiple Vitamin (MULTIVITAMIN PO), Take by mouth., Disp: , Rfl:    Multiple Vitamins-Minerals (EMERGEN-C IMMUNE PO), Take 1 capsule by mouth as needed., Disp: , Rfl:   Review of Systems:  Negative unless indicated in HPI.   Physical Exam: Vitals:   04/05/23 1658  BP: 102/68  Pulse: (!) 59  Temp:  97.9 F (36.6 C)  TempSrc: Oral  SpO2: 98%  Weight: 114 lb 3.2 oz (51.8 kg)  Height: 5\' 8"  (1.727 m)    Body mass index is 17.36 kg/m.   Physical Exam Vitals reviewed.  Constitutional:      General: She is not in acute distress.    Appearance: Normal appearance. She is not ill-appearing, toxic-appearing or diaphoretic.  HENT:     Head: Normocephalic.     Right Ear: Tympanic membrane, ear canal and external ear normal. There is no impacted cerumen.     Left Ear: Tympanic membrane, ear canal and external ear normal. There is no impacted cerumen.     Nose: Nose normal.     Mouth/Throat:     Mouth: Mucous membranes are moist.     Pharynx: Oropharynx is clear. No oropharyngeal exudate or posterior oropharyngeal erythema.  Eyes:     General: No scleral icterus.       Right eye: No  discharge.        Left eye: No discharge.     Conjunctiva/sclera: Conjunctivae normal.     Pupils: Pupils are equal, round, and reactive to light.  Neck:     Vascular: No carotid bruit.  Cardiovascular:     Rate and Rhythm: Normal rate and regular rhythm.     Pulses: Normal pulses.     Heart sounds: Normal heart sounds.  Pulmonary:     Effort: Pulmonary effort is normal. No respiratory distress.     Breath sounds: Normal breath sounds.  Abdominal:     General: Abdomen is flat. Bowel sounds are normal.     Palpations: Abdomen is soft.  Musculoskeletal:        General: Normal range of motion.     Cervical back: Normal range of motion.  Skin:    General: Skin is warm and dry.  Neurological:     General: No focal deficit present.     Mental Status: She is alert and oriented to person, place, and time. Mental status is at baseline.  Psychiatric:        Mood and Affect: Mood normal.        Behavior: Behavior normal.        Thought Content: Thought content normal.        Judgment: Judgment normal.    Subsequent Medicare wellness visit   1. Risk factors, based on past  M,S,F - Cardiac Risk Factors include: diabetes mellitus;advanced age (>47men, >40 women)   2.  Physical activities: Dietary issues and exercise activities discussed:  Current Exercise Habits: Home exercise routine;Structured exercise class, Type of exercise: calisthenics;strength training/weights;treadmill, Time (Minutes): > 60, Frequency (Times/Week): 6, Weekly Exercise (Minutes/Week): 0, Intensity: Moderate   3.  Depression/mood:  Flowsheet Row Office Visit from 04/06/2023 in [facility] Total Score 1        4.  ADL's:    04/05/2023    4:57 PM  In your present state of health, do you have any difficulty performing the following activities:  Hearing? 0  Vision? 0  Difficulty concentrating or making decisions? 0  Walking or climbing stairs? 0  Dressing or bathing? 0   Doing errands, shopping? 0  Preparing Food and eating ? N  Using the Toilet? N  In the past six months, have you accidently leaked urine? N  Do you have problems with loss of bowel control? N  Managing your Medications? N  Managing your Finances? N  Housekeeping or managing your Housekeeping? N     5.  Fall risk:     01/23/2022    2:28 PM 01/29/2022    8:09 AM 08/11/2022    4:07 PM 02/10/2023   10:00 AM 04/05/2023    5:01 PM  Fall Risk  Falls in the past year? 1 0 0 0 0  Was there an injury with Fall? 1 0 0 0 0  Fall Risk Category Calculator 2 0 0 0 0  Fall Risk Category (Retired) Moderate Low Low    (RETIRED) Patient Fall Risk Level Low fall risk Low fall risk Low fall risk    Patient at Risk for Falls Due to No Fall Risks No Fall Risks No Fall Risks No Fall Risks   Fall risk Follow up Falls evaluation completed Falls evaluation completed Falls evaluation completed Falls evaluation completed Falls evaluation completed     6.  Home safety: No problems identified   7.  Height weight, and visual acuity: height and weight as above, vision/hearing: Vision Screening   Right eye Left eye Both eyes  Without correction     With correction 20/20 20/20 20/20      8.  Counseling: Counseling given: Not Answered    9. Lab orders based on risk factors: Laboratory update will be reviewed   10. Cognitive assessment:        04/05/2023    5:02 PM  6CIT Screen  What Year? 0 points  What month? 0 points  What time? 0 points  Count back from 20 0 points  Months in reverse 0 points  Repeat phrase 0 points  Total Score 0 points     11. Screening: Patient provided with a written and personalized 5-10 year screening schedule in the AVS. Health Maintenance  Topic Date Due   Hepatitis C Screening  Never done   COVID-19 Vaccine (8 - 2023-24 season) 09/10/2022   Medicare Annual Wellness Visit  01/30/2023   Flu Shot  05/27/2023   Mammogram  10/21/2024   DTaP/Tdap/Td vaccine (3 - Td or  Tdap) 01/15/2025   Colon Cancer Screening  05/14/2025   Pneumonia Vaccine  Completed   DEXA scan (bone density measurement)  Completed   Zoster (Shingles) Vaccine  Completed   HPV Vaccine  Aged Out    12. Provider List Update: Patient Care Team    Relationship Specialty Notifications Start End  Philip Aspen, Limmie Patricia, MD PCP - General Internal Medicine  08/25/19   Kathleene Hazel, MD PCP - Cardiology Cardiology  01/27/21      13. Advance Directives: Does Patient Have a Medical Advance Directive?: Yes Type of Advance Directive: Out of facility DNR (pink MOST or yellow form), Living will, Healthcare Power of Attorney Does patient want to make changes to medical advance directive?: No - Patient declined Copy of Healthcare Power of Attorney in Chart?: No - copy requested  14. Opioids: Patient is not on any opioid prescriptions and has no risk factors for a substance use disorder.   15.   Goals      Protect My Health     Timeframe:  Long-Range Goal Priority:  Medium Start Date:                             Expected End Date:                       Follow Up Date  04/05/2023    - schedule and keep appointment for annual check-up    Why is this important?   Screening tests can find diseases early when they are easier to treat.  Your doctor or nurse will talk with you about which tests are important for you.  Getting shots for common diseases like the flu and shingles will help prevent them.     Notes:          I have personally reviewed and noted the following in the patient's chart:   Medical and social history Use of alcohol, tobacco or illicit drugs  Current medications and supplements Functional ability and status Nutritional status Physical activity Advanced directives List of other physicians Hospitalizations, surgeries, and ER visits in previous 12 months Vitals Screenings to include cognitive, depression, and falls Referrals and appointments  In  addition, I have reviewed and discussed with patient certain preventive protocols, quality metrics, and best practice recommendations. A written personalized care plan for preventive services as well as general preventive health recommendations were provided to patient.   Impression and Plan:  Primary hypertension -     CBC with Differential/Platelet; Future -     Comprehensive metabolic panel; Future  Mixed hyperlipidemia -     Lipid panel; Future  Encounter for preventive health examination  Encounter for screening for osteoporosis  Age-related osteoporosis without current pathological fracture -     TSH; Future -     Vitamin B12; Future -     VITAMIN D 25 Hydroxy (Vit-D Deficiency, Fractures); Future -     Hemoglobin A1c -     DG Bone Density; Future   -Recommend routine eye and dental care. -Healthy lifestyle discussed in detail. -Labs to be updated today. -Prostate cancer screening: N/A Health Maintenance  Topic Date Due   Hepatitis C Screening  Never done   COVID-19 Vaccine (8 - 2023-24 season) 09/10/2022   Medicare Annual Wellness Visit  01/30/2023   Flu Shot  05/27/2023   Mammogram  10/21/2024   DTaP/Tdap/Td vaccine (3 - Td or Tdap) 01/15/2025   Colon Cancer Screening  05/14/2025   Pneumonia Vaccine  Completed   DEXA scan (bone density measurement)  Completed   Zoster (Shingles) Vaccine  Completed   HPV Vaccine  Aged Out          Farin Buhman Philip Aspen, MD [facility]

## 2023-04-06 NOTE — Telephone Encounter (Signed)
PCP wants patient to start Prolia. Benefits submitted via [employer], awaiting response.

## 2023-04-06 NOTE — Telephone Encounter (Signed)
Faxed PA to insurance, awaiting decision.

## 2023-04-12 ENCOUNTER — Encounter: Payer: Self-pay | Admitting: Internal Medicine

## 2023-04-12 NOTE — Telephone Encounter (Signed)
Okay to go ahead and get Prolia or does she need to wait for bone scan?

## 2023-04-12 NOTE — Telephone Encounter (Signed)
Approved, see mychart encounter.

## 2023-04-13 NOTE — Telephone Encounter (Signed)
Changed to 2pm. Not sure about the dexa scan appt?

## 2023-04-14 ENCOUNTER — Encounter: Payer: Self-pay | Admitting: Internal Medicine

## 2023-04-19 ENCOUNTER — Ambulatory Visit (INDEPENDENT_AMBULATORY_CARE_PROVIDER_SITE_OTHER): Payer: Medicare HMO

## 2023-04-19 ENCOUNTER — Telehealth: Payer: Self-pay

## 2023-04-19 DIAGNOSIS — M810 Age-related osteoporosis without current pathological fracture: Secondary | ICD-10-CM

## 2023-04-19 MED ORDER — DENOSUMAB 60 MG/ML ~~LOC~~ SOSY
60.0000 mg | PREFILLED_SYRINGE | Freq: Once | SUBCUTANEOUS | Status: AC
Start: 2023-04-19 — End: 2023-04-19
  Administered 2023-04-19: 60 mg via SUBCUTANEOUS

## 2023-04-19 NOTE — Telephone Encounter (Signed)
Patient Advocate Encounter  Prior Authorization for Nicole Snow has been approved with [employer].    PA# [id number] Effective dates: 04/06/23 through 04/05/24

## 2023-04-19 NOTE — Progress Notes (Signed)
Per orders of Dr. Ardyth Harps, injection of Prolia 60mg  given by Vickii Chafe on R upper arm. Patient tolerated injection well.

## 2023-04-20 ENCOUNTER — Telehealth: Payer: Self-pay | Admitting: Internal Medicine

## 2023-04-20 NOTE — Telephone Encounter (Signed)
Requesting information for DEXA scan, faxed over referral this morning has an 8am appointment with them

## 2023-04-20 NOTE — Telephone Encounter (Signed)
Re faxed, confirmed, and verbally confirmed by phone.

## 2023-04-21 DIAGNOSIS — N951 Menopausal and female climacteric states: Secondary | ICD-10-CM | POA: Diagnosis not present

## 2023-04-21 DIAGNOSIS — M8589 Other specified disorders of bone density and structure, multiple sites: Secondary | ICD-10-CM | POA: Diagnosis not present

## 2023-04-21 LAB — HM DEXA SCAN

## 2023-04-27 ENCOUNTER — Encounter: Payer: Self-pay | Admitting: Internal Medicine

## 2023-04-27 DIAGNOSIS — I10 Essential (primary) hypertension: Secondary | ICD-10-CM

## 2023-04-29 DIAGNOSIS — S30861A Insect bite (nonvenomous) of abdominal wall, initial encounter: Secondary | ICD-10-CM | POA: Diagnosis not present

## 2023-04-29 DIAGNOSIS — W57XXXA Bitten or stung by nonvenomous insect and other nonvenomous arthropods, initial encounter: Secondary | ICD-10-CM | POA: Diagnosis not present

## 2023-05-18 ENCOUNTER — Encounter: Payer: Self-pay | Admitting: Internal Medicine

## 2023-05-24 DIAGNOSIS — J069 Acute upper respiratory infection, unspecified: Secondary | ICD-10-CM | POA: Diagnosis not present

## 2023-05-31 MED ORDER — HYDROCHLOROTHIAZIDE 25 MG PO TABS
25.0000 mg | ORAL_TABLET | Freq: Every day | ORAL | 1 refills | Status: DC
Start: 2023-05-31 — End: 2023-07-13

## 2023-06-02 DIAGNOSIS — M542 Cervicalgia: Secondary | ICD-10-CM | POA: Diagnosis not present

## 2023-06-07 DIAGNOSIS — M542 Cervicalgia: Secondary | ICD-10-CM | POA: Diagnosis not present

## 2023-07-12 ENCOUNTER — Encounter: Payer: Self-pay | Admitting: Internal Medicine

## 2023-07-12 DIAGNOSIS — I10 Essential (primary) hypertension: Secondary | ICD-10-CM

## 2023-07-13 MED ORDER — HYDRALAZINE HCL 25 MG PO TABS: 25.0000 mg | ORAL_TABLET | Freq: Two times a day (BID) | ORAL | 1 refills | Status: DC | PRN

## 2023-07-13 MED ORDER — SCOPOLAMINE 1 MG/3DAYS TD PT72: 1.0000 | MEDICATED_PATCH | TRANSDERMAL | 12 refills | Status: AC

## 2023-07-13 MED ORDER — HYDROCHLOROTHIAZIDE 25 MG PO TABS
25.0000 mg | ORAL_TABLET | Freq: Every day | ORAL | 1 refills | Status: DC
Start: 2023-07-13 — End: 2023-07-20

## 2023-07-13 MED ORDER — ALPRAZOLAM 0.25 MG PO TABS: 0.2500 mg | ORAL_TABLET | Freq: Every day | ORAL | 0 refills | Status: DC | PRN

## 2023-07-13 NOTE — Addendum Note (Signed)
Addended by: Kern Reap B on: 07/13/2023 04:54 PM   Modules accepted: Orders

## 2023-07-15 ENCOUNTER — Telehealth: Payer: Self-pay

## 2023-07-15 ENCOUNTER — Other Ambulatory Visit (HOSPITAL_COMMUNITY): Payer: Self-pay

## 2023-07-15 NOTE — Telephone Encounter (Signed)
Pharmacy Patient Advocate Encounter   Received notification from Physician's Office that prior authorization for Scopolamine is required/requested.   Insurance verification completed.   The patient is insured through [facility] .   Per test claim: PA required; PA submitted to [facility] via CoverMyMeds Key/confirmation #/EOC Key: [id number]  Status is pending

## 2023-07-18 ENCOUNTER — Encounter: Payer: Self-pay | Admitting: Internal Medicine

## 2023-07-19 ENCOUNTER — Encounter: Payer: Self-pay | Admitting: *Deleted

## 2023-07-19 ENCOUNTER — Other Ambulatory Visit (HOSPITAL_COMMUNITY): Payer: Self-pay

## 2023-07-19 DIAGNOSIS — I10 Essential (primary) hypertension: Secondary | ICD-10-CM

## 2023-07-19 NOTE — Telephone Encounter (Signed)
Pharmacy Patient Advocate Encounter  Received notification from [facility] that Prior Authorization for Scopolamine patches has been APPROVED from 9.19.24 to 12.31.24. Ran test claim, and The Rx has been Filled on 07/15/23.    This test claim was processed through [facility]- copay amounts may vary at other pharmacies due to pharmacy/plan contracts, or as the patient moves through the different stages of their insurance plan.   PA #/Case ID/Reference #: Key: [id number]

## 2023-07-20 MED ORDER — HYDROCHLOROTHIAZIDE 25 MG PO TABS: 25.0000 mg | ORAL_TABLET | Freq: Every day | ORAL | 1 refills | Status: DC

## 2023-07-22 ENCOUNTER — Ambulatory Visit: Payer: Medicare HMO | Admitting: Orthopedic Surgery

## 2023-07-22 DIAGNOSIS — M5412 Radiculopathy, cervical region: Secondary | ICD-10-CM | POA: Diagnosis not present

## 2023-07-22 MED ORDER — METHYLPREDNISOLONE 4 MG PO TBPK: ORAL_TABLET | ORAL | 0 refills | Status: AC

## 2023-07-22 NOTE — Progress Notes (Signed)
Orthopedic Spine Surgery Office Note   Assessment: Patient is a 72 y.o. female with neck pain that radiates into the left trapezius and shoulder.  Has left-sided foraminal stenosis at C3/4, C4/5, C5/6 causing radiculopathy     Plan: -Patient has tried trigger point injections, extracorporeal shockwave treatment, gabapentin, PT, intramuscular steroid injection, Tylenol, Advil -Continue with traction since it has been helpful -Prescribed a medrol dosepak in case her returns and is bad on the cruise -Discussed cervical epidural injection as a next treatment option if radiating arm pain returns -Would want her to get her osteoporosis treated before surgical intervention -Patient is going on a cruise at the end of next month and I told her I would see her back after that     Patient expressed understanding of the plan and all questions were answered to the patient's satisfaction.    ___________________________________________________________________________     History:   Patient is a 72 y.o. female who presents today for follow up on her cervical spine.  After our last visit, patient started using home cervical traction device.  She has noted significant relief with that.  She is no longer having radiating arm pain.  About a week ago she did have worse neck pain especially on the left side.  She said she was moving heavy furniture as they were moving houses.  This aggravated her pain.  Today, her pain is better.  She rates her neck pain as a 2 out of 10.  She is not having any pain radiating into either upper extremity.  She has not noticed any difficulty with fine motor skills of the hands.  She is active playing pickle ball and played this morning.  She has not noticed any unsteadiness with gait.  Denies paresthesias and numbness.  Treatments tried: trigger point injections, extracorporeal shockwave treatment, gabapentin, PT, intramuscular steroid injection, Tylenol, Advil   Physical Exam:    General: no acute distress, appears stated age Neurologic: alert, answering questions appropriately, following commands Respiratory: unlabored breathing on room air, symmetric chest rise Psychiatric: appropriate affect, normal cadence to speech     MSK (spine):   -Strength exam                                                   Left                  Right Grip strength                5/5                  5/5 Interosseus                  5/5                  5/5 Wrist extension            5/5                  5/5 Wrist flexion                 5/5                  5/5 Elbow flexion                5/5  5/5 Deltoid                          5/5                  5/5   -Sensory exam                          Sensation intact to light touch in C5-T1 nerve distributions of bilateral upper extremities   -Spurling: Negative bilaterally     Imaging: XR of the cervical spine from 04/05/2023 was previously independently reviewed and interpreted, showing disc height loss at C3/4, C4/5, C5/6. Anterior osteophyte formation at C3/4 and C5/6. No evidence of instability on flexion/extension views. No fracture or dislocation seen.    MRI of the cervical spine from 10/16/2022 was previously independently reviewed and interpreted, showing left sided foraminal stenosis at C3/4 and C4/5. Central and bilateral foraminal stenosis at C5/6. No T2 cord signal change.      Patient name: Nicole Snow Patient MRN: [id number] Date of visit: 07/22/23

## 2023-07-26 DIAGNOSIS — M542 Cervicalgia: Secondary | ICD-10-CM | POA: Diagnosis not present

## 2023-08-02 DIAGNOSIS — M542 Cervicalgia: Secondary | ICD-10-CM | POA: Diagnosis not present

## 2023-08-09 ENCOUNTER — Other Ambulatory Visit: Payer: Self-pay | Admitting: Internal Medicine

## 2023-08-20 ENCOUNTER — Other Ambulatory Visit: Payer: Medicare Other

## 2023-09-07 ENCOUNTER — Encounter: Payer: Self-pay | Admitting: Internal Medicine

## 2023-09-08 ENCOUNTER — Encounter: Payer: Self-pay | Admitting: Podiatry

## 2023-09-08 NOTE — Telephone Encounter (Signed)
She'll probably need to check with her insurance to make sure they'll cover it that soon.

## 2023-09-10 ENCOUNTER — Ambulatory Visit: Payer: Medicare HMO | Admitting: Podiatry

## 2023-09-10 ENCOUNTER — Other Ambulatory Visit: Payer: Medicare Other

## 2023-09-10 ENCOUNTER — Encounter: Payer: Self-pay | Admitting: Podiatry

## 2023-09-10 DIAGNOSIS — S90112A Contusion of left great toe without damage to nail, initial encounter: Secondary | ICD-10-CM | POA: Diagnosis not present

## 2023-09-10 DIAGNOSIS — L600 Ingrowing nail: Secondary | ICD-10-CM | POA: Diagnosis not present

## 2023-09-10 NOTE — Progress Notes (Signed)
Subjective:   Patient ID: Nicole Snow, female   DOB: 72 y.o.   MRN: [id number]   HPI Patient presents stating she has had an ingrown toenail of the left big toe and has been sore and hard for her to wear shoe gear with..  She also is concerned that she contused the toe and she is worried about bone injury   ROS      Objective:  Physical Exam  Neurovascular status intact incurvated of the medial border left big toe sore when pressed with slight discoloration of the nailbed with trauma     Assessment:  Probability for ingrown toenail deformity left hallux with patient also found to have contusion of the toe and nailbed     Plan:  H&P x-rays reviewed and at this point recommended correction of the nail border which I think will take care of problems.  I explained procedure as she signed consent form understanding risk and I infiltrated the left big toe 60 mg like Marcaine mixture sterile prep done using sterile instrumentation remove the border exposed matrix applied phenol 3 applications 30 seconds followed by alcohol of sterile dressing gave instructions on soaks wear dressing 24 hours take it off earlier if throbbing were to occur

## 2023-09-13 MED ORDER — DENOSUMAB 60 MG/ML ~~LOC~~ SOSY
60.0000 mg | PREFILLED_SYRINGE | Freq: Once | SUBCUTANEOUS | 0 refills | Status: AC
Start: 2023-09-13 — End: 2023-09-13

## 2023-09-15 ENCOUNTER — Ambulatory Visit (INDEPENDENT_AMBULATORY_CARE_PROVIDER_SITE_OTHER): Payer: Medicare HMO

## 2023-09-16 ENCOUNTER — Telehealth: Payer: Self-pay | Admitting: Internal Medicine

## 2023-09-16 NOTE — Telephone Encounter (Signed)
Nicole Snow with [facility] called to say they will be delivering Pt's Prolia on Wednesday, 09/22/23.

## 2023-09-20 ENCOUNTER — Encounter (INDEPENDENT_AMBULATORY_CARE_PROVIDER_SITE_OTHER): Payer: Self-pay

## 2023-09-20 ENCOUNTER — Ambulatory Visit (INDEPENDENT_AMBULATORY_CARE_PROVIDER_SITE_OTHER): Payer: Medicare HMO | Admitting: Otolaryngology

## 2023-09-20 VITALS — Ht 68.0 in | Wt 113.0 lb

## 2023-09-20 DIAGNOSIS — H6122 Impacted cerumen, left ear: Secondary | ICD-10-CM

## 2023-09-20 NOTE — Progress Notes (Signed)
Patient ID: Nicole Snow, female   DOB: 06/26/1951, 72 y.o.   MRN: [id number]  Follow up: Recurrent left ear cerumen impaction, stenotic ear canal  Procedure: Left ear cerumen disimpaction.    Indication: Cerumen impaction, resulting in ear discomfort and conductive hearing loss.    Description: The patient is placed supine on the exam table.  Under the operating microscope, the left ear canal is examined and is noted to be completely impacted with cerumen.  The left ear canal is stenotic and collapsible.  The cerumen is carefully removed with a combination of suction catheters, cerumen curette, and alligator forceps.  After the cerumen removal, the ear canal and tympanic membrane are noted to be normal.  No middle ear effusion is noted.  No significant impaction is noted on the right side.  The patient tolerated the procedure well.  Follow-up care:  The patient is instructed not to use Q-tips to clean the ear canals.  The patient will follow up in 6 months.

## 2023-09-29 ENCOUNTER — Ambulatory Visit: Payer: Medicare HMO | Admitting: *Deleted

## 2023-09-29 DIAGNOSIS — M810 Age-related osteoporosis without current pathological fracture: Secondary | ICD-10-CM

## 2023-09-29 DIAGNOSIS — M542 Cervicalgia: Secondary | ICD-10-CM | POA: Diagnosis not present

## 2023-09-29 MED ORDER — DENOSUMAB 60 MG/ML ~~LOC~~ SOSY
60.0000 mg | PREFILLED_SYRINGE | Freq: Once | SUBCUTANEOUS | Status: AC
Start: 2023-09-29 — End: 2023-09-29
  Administered 2023-09-29: 60 mg via SUBCUTANEOUS

## 2023-09-29 NOTE — Progress Notes (Signed)
Per orders of Dr. Hernandez, injection of Prolia 60mg given by Le Faulcon A. Patient tolerated injection well.  

## 2023-10-05 ENCOUNTER — Ambulatory Visit: Payer: Medicare HMO | Admitting: Podiatry

## 2023-10-05 ENCOUNTER — Encounter: Payer: Self-pay | Admitting: Podiatry

## 2023-10-05 ENCOUNTER — Ambulatory Visit (INDEPENDENT_AMBULATORY_CARE_PROVIDER_SITE_OTHER): Payer: Medicare HMO

## 2023-10-05 DIAGNOSIS — S90112D Contusion of left great toe without damage to nail, subsequent encounter: Secondary | ICD-10-CM | POA: Diagnosis not present

## 2023-10-05 DIAGNOSIS — M778 Other enthesopathies, not elsewhere classified: Secondary | ICD-10-CM

## 2023-10-05 NOTE — Patient Instructions (Signed)

## 2023-10-05 NOTE — Progress Notes (Unsigned)
Wearing a pair of shoes too small and playing pickeball and kept jamming the toe.  Using lidocaine patch and tylenol.  Did not do prodedure last itme

## 2023-10-06 DIAGNOSIS — Z823 Family history of stroke: Secondary | ICD-10-CM | POA: Diagnosis not present

## 2023-10-06 DIAGNOSIS — Z008 Encounter for other general examination: Secondary | ICD-10-CM | POA: Diagnosis not present

## 2023-10-06 DIAGNOSIS — M4800 Spinal stenosis, site unspecified: Secondary | ICD-10-CM | POA: Diagnosis not present

## 2023-10-06 DIAGNOSIS — Z8249 Family history of ischemic heart disease and other diseases of the circulatory system: Secondary | ICD-10-CM | POA: Diagnosis not present

## 2023-10-06 DIAGNOSIS — R64 Cachexia: Secondary | ICD-10-CM | POA: Diagnosis not present

## 2023-10-06 DIAGNOSIS — Z681 Body mass index (BMI) 19 or less, adult: Secondary | ICD-10-CM | POA: Diagnosis not present

## 2023-10-06 DIAGNOSIS — M810 Age-related osteoporosis without current pathological fracture: Secondary | ICD-10-CM | POA: Diagnosis not present

## 2023-10-06 DIAGNOSIS — I10 Essential (primary) hypertension: Secondary | ICD-10-CM | POA: Diagnosis not present

## 2023-10-06 DIAGNOSIS — Z79620 Long term (current) use of immunosuppressive biologic: Secondary | ICD-10-CM | POA: Diagnosis not present

## 2023-10-06 DIAGNOSIS — Z96649 Presence of unspecified artificial hip joint: Secondary | ICD-10-CM | POA: Diagnosis not present

## 2023-10-06 DIAGNOSIS — Z880 Allergy status to penicillin: Secondary | ICD-10-CM | POA: Diagnosis not present

## 2023-10-14 ENCOUNTER — Ambulatory Visit (INDEPENDENT_AMBULATORY_CARE_PROVIDER_SITE_OTHER): Payer: Medicare HMO

## 2023-10-25 DIAGNOSIS — B351 Tinea unguium: Secondary | ICD-10-CM | POA: Diagnosis not present

## 2023-10-25 DIAGNOSIS — L603 Nail dystrophy: Secondary | ICD-10-CM | POA: Diagnosis not present

## 2023-11-07 ENCOUNTER — Encounter: Payer: Self-pay | Admitting: Internal Medicine

## 2023-11-07 DIAGNOSIS — I10 Essential (primary) hypertension: Secondary | ICD-10-CM

## 2023-11-07 DIAGNOSIS — Z1239 Encounter for other screening for malignant neoplasm of breast: Secondary | ICD-10-CM

## 2023-11-08 DIAGNOSIS — L603 Nail dystrophy: Secondary | ICD-10-CM | POA: Diagnosis not present

## 2023-11-08 MED ORDER — HYDROCHLOROTHIAZIDE 25 MG PO TABS: 25.0000 mg | ORAL_TABLET | Freq: Every day | ORAL | 1 refills | Status: DC

## 2023-11-11 MED ORDER — HYDROCHLOROTHIAZIDE 25 MG PO TABS: 25.0000 mg | ORAL_TABLET | Freq: Every day | ORAL | 1 refills | Status: DC

## 2023-11-11 NOTE — Addendum Note (Signed)
Addended by: Kern Reap B on: 11/11/2023 07:48 AM   Modules accepted: Orders

## 2023-11-24 DIAGNOSIS — Z1231 Encounter for screening mammogram for malignant neoplasm of breast: Secondary | ICD-10-CM | POA: Diagnosis not present

## 2023-11-24 LAB — HM MAMMOGRAPHY

## 2023-11-25 LAB — HM MAMMOGRAPHY: HM Mammogram: ABNORMAL — AB (ref 0–4)

## 2023-12-06 NOTE — Addendum Note (Signed)
 Addended by: Nicolina Barrios B on: 12/06/2023 03:07 PM   Modules accepted: Orders

## 2023-12-10 ENCOUNTER — Encounter: Payer: Self-pay | Admitting: Internal Medicine

## 2023-12-15 ENCOUNTER — Telehealth: Payer: Self-pay | Admitting: *Deleted

## 2023-12-15 NOTE — Telephone Encounter (Signed)
 Copied from CRM [id number]. Topic: Clinical - Request for Lab/Test Order >> Dec 15, 2023 11:27 AM Ernst Spell wrote: Reason for CRM: Abrielle from [facility] called and stated that they have a patient scheduled for a breast ultrasound but they have not received the order yet. Please fax to [phone], callback # [phone].

## 2023-12-20 NOTE — Telephone Encounter (Signed)
 Patient is scheduled for 12/23/23

## 2023-12-23 ENCOUNTER — Telehealth: Payer: Self-pay | Admitting: *Deleted

## 2023-12-23 DIAGNOSIS — R928 Other abnormal and inconclusive findings on diagnostic imaging of breast: Secondary | ICD-10-CM | POA: Diagnosis not present

## 2023-12-23 NOTE — Telephone Encounter (Signed)
 Copied from CRM [id number]. Topic: Clinical - Request for Lab/Test Order >> Dec 23, 2023  2:00 PM Isabell A wrote: Reason for CRM: Josh from [facility] - patient is currently there for an ultrasound, he states the codes need to be changed for the diagnosis R92.8  Fax #[phone]

## 2024-01-05 ENCOUNTER — Other Ambulatory Visit: Payer: Self-pay | Admitting: Internal Medicine

## 2024-01-05 MED ORDER — ALPRAZOLAM 0.25 MG PO TABS: 0.2500 mg | ORAL_TABLET | Freq: Every day | ORAL | 0 refills | Status: AC | PRN

## 2024-02-10 DIAGNOSIS — L538 Other specified erythematous conditions: Secondary | ICD-10-CM | POA: Diagnosis not present

## 2024-02-10 DIAGNOSIS — L820 Inflamed seborrheic keratosis: Secondary | ICD-10-CM | POA: Diagnosis not present

## 2024-02-10 DIAGNOSIS — L814 Other melanin hyperpigmentation: Secondary | ICD-10-CM | POA: Diagnosis not present

## 2024-02-10 DIAGNOSIS — R208 Other disturbances of skin sensation: Secondary | ICD-10-CM | POA: Diagnosis not present

## 2024-02-10 DIAGNOSIS — D225 Melanocytic nevi of trunk: Secondary | ICD-10-CM | POA: Diagnosis not present

## 2024-02-10 DIAGNOSIS — L448 Other specified papulosquamous disorders: Secondary | ICD-10-CM | POA: Diagnosis not present

## 2024-02-10 DIAGNOSIS — L821 Other seborrheic keratosis: Secondary | ICD-10-CM | POA: Diagnosis not present

## 2024-02-10 DIAGNOSIS — L570 Actinic keratosis: Secondary | ICD-10-CM | POA: Diagnosis not present

## 2024-02-15 ENCOUNTER — Encounter: Payer: Self-pay | Admitting: Internal Medicine

## 2024-02-15 DIAGNOSIS — I10 Essential (primary) hypertension: Secondary | ICD-10-CM

## 2024-02-15 MED ORDER — HYDROCHLOROTHIAZIDE 25 MG PO TABS: 25.0000 mg | ORAL_TABLET | Freq: Every day | ORAL | 1 refills | Status: DC

## 2024-02-16 DIAGNOSIS — M542 Cervicalgia: Secondary | ICD-10-CM | POA: Diagnosis not present

## 2024-02-24 ENCOUNTER — Ambulatory Visit: Payer: Medicare HMO | Admitting: Family Medicine

## 2024-02-24 ENCOUNTER — Encounter: Payer: Self-pay | Admitting: Family Medicine

## 2024-02-24 DIAGNOSIS — Z0000 Encounter for general adult medical examination without abnormal findings: Secondary | ICD-10-CM

## 2024-02-24 NOTE — Progress Notes (Signed)
 PATIENT CHECK-IN and HEALTH RISK ASSESSMENT QUESTIONNAIRE:  -completed by phone/video for upcoming Medicare Preventive Visit  Pre-Visit Check-in: 1)Vitals (height, wt, BP, etc) - record in vitals section for visit on day of visit Request home vitals (wt, BP, etc.) and enter into vitals, THEN update Vital Signs SmartPhrase below at the top of the HPI. See below.  2)Review and Update Medications, Allergies PMH, Surgeries, Social history in Epic 3)Hospitalizations in the last year with date/reason? No  4)Review and Update Care Team (patient's specialists) in Epic 5) Complete PHQ9 in Epic  6) Complete Fall Screening in Epic 7)Review all Health Maintenance Due and order under PCP if not done.  Medicare Wellness Patient Questionnaire:  Answer theses question about your habits: How often do you have a drink containing alcohol?yes, 6 days per week How many drinks containing alcohol do you have on a typical day when you are drinking?1 glass of wine How often do you have six or more drinks on one occasion?never Have you ever smoked?No  How many packs a day do/did you smoke? N/A Do you use smokeless tobacco?No Do you use an illicit drugs?No On average, how many days per week do you engage in moderate to strenuous exercise (like a brisk walk)? 7  On average, how many minutes do you engage in exercise at this level? 60 minutes  Are you sexually active?  Yes Number of partners?1 Typical breakfast: Oatmeal and fruit Typical lunch: Varies  Typical dinner: Varies  Typical snacks: Banana, Chips   Beverages: Coffee and water   Answer theses question about your everyday activities: Can you perform most household chores?Yes  Are you deaf or have significant trouble hearing?No Do you feel that you have a problem with memory? No Do you feel safe at home? Yes  Last dentist visit? 6 moths ago 8. Do you have any difficulty performing your everyday activities?No Are you having any difficulty walking,  taking medications on your own, and or difficulty managing daily home needs?No Do you have difficulty walking or climbing stairs?No Do you have difficulty dressing or bathing?No Do you have difficulty doing errands alone such as visiting a doctor's office or shopping?No Do you currently have any difficulty preparing food and eating?No Do you currently have any difficulty using the toilet?No Do you have any difficulty managing your finances?No Do you have any difficulties with housekeeping of managing your housekeeping?No   Do you have Advanced Directives in place (Living Will, Healthcare Power or Attorney)? Yes    Last eye Exam and location? 1 year ago, [facility]    Do you currently use prescribed or non-prescribed narcotic or opioid pain medications? No  Do you have a history or close family history of breast, ovarian, tubal or peritoneal cancer or a family member with BRCA (breast cancer susceptibility 1 and 2) gene mutations? No  Nurse/Assistant Credentials/time stamp: Mg 2:17 pm    ----------------------------------------------------------------------------------------------------------------------------------------------------------------------------------------------------------------------  Because this visit was a virtual/telehealth visit, some criteria may be missing or patient reported. Any vitals not documented were not able to be obtained and vitals that have been documented are patient reported.    MEDICARE ANNUAL PREVENTIVE VISIT WITH PROVIDER: (Welcome to Medicare, initial annual wellness or annual wellness exam)  Virtual Visit via Video Note  I connected with Nicole  Vaughn Snow on 02/24/24 by a video enabled telemedicine application and verified that I am speaking with the correct person using two identifiers.  Location patient: car, confirmed in [place] Location provider:work or home office Persons participating  in the virtual visit: patient,  provider  Concerns and/or follow up today: no concerns   See HM section in Epic for other details of completed HM.    ROS: negative for report of fevers, unintentional weight loss, vision changes, vision loss, hearing loss or change, chest pain, sob, hemoptysis, melena, hematochezia, hematuria, falls, bleeding or bruising, thoughts of suicide or self harm, memory loss  Patient-completed extensive health risk assessment - reviewed and discussed with the patient: See Health Risk Assessment completed with patient prior to the visit either above or in recent phone note. This was reviewed in detailed with the patient today and appropriate recommendations, orders and referrals were placed as needed per Summary below and patient instructions.   Review of Medical History: -PMH, PSH, Family History and current specialty and care providers reviewed and updated and listed below   Patient Care Team: Zilphia Hilt, Charyl Coppersmith, MD as PCP - General (Internal Medicine) Odie Benne, MD as PCP - Cardiology (Cardiology)   Past Medical History:  Diagnosis Date   Allergy    C. difficile colitis 08/2016   HTN (hypertension)     Past Surgical History:  Procedure Laterality Date   CHOLECYSTECTOMY     COLONOSCOPY  02/27/2009   T A polyp   HYSTEROSCOPY WITH D & C  1992   KNEE ARTHROSCOPY     LASIK Bilateral    REPLACEMENT TOTAL HIP W/  RESURFACING IMPLANTS Bilateral    TENDON REPAIR Right 1982   TENDON REPAIR     left hand ring finge at age 73 yr old   TONSILLECTOMY     WISDOM TOOTH EXTRACTION      Social History   Socioeconomic History   Marital status: Married    Spouse name: Not on file   Number of children: 1   Years of education: Not on file   Highest education level: Not on file  Occupational History   Occupation: retired-realtor  Tobacco Use   Smoking status: Never   Smokeless tobacco: Never  Vaping Use   Vaping status: Never Used  Substance and Sexual Activity    Alcohol use: Yes    Alcohol/week: 7.0 standard drinks of alcohol    Types: 7 Glasses of wine per week    Comment: beer/wine   Drug use: Never   Sexual activity: Not Currently    Partners: Male    Birth control/protection: Post-menopausal    Comment: older than 16, less than 5  Other Topics Concern   Not on file  Social History Narrative   Not on file   Social Drivers of Health   Financial Resource Strain: Low Risk  (02/24/2024)   Overall Financial Resource Strain (CARDIA)    Difficulty of Paying Living Expenses: Not hard at all  Food Insecurity: No Food Insecurity (02/24/2024)   Hunger Vital Sign    Worried About Running Out of Food in the Last Year: Never true    Ran Out of Food in the Last Year: Never true  Transportation Needs: No Transportation Needs (02/24/2024)   PRAPARE - Administrator, Civil Service (Medical): No    Lack of Transportation (Non-Medical): No  Physical Activity: Sufficiently Active (02/24/2024)   Exercise Vital Sign    Days of Exercise per Week: 7 days    Minutes of Exercise per Session: 60 min  Stress: No Stress Concern Present (02/24/2024)   [employer] of Occupational Health - Occupational Stress Questionnaire    Feeling of Stress :  Not at all  Social Connections: Moderately Integrated (02/24/2024)   Social Connection and Isolation Panel [NHANES]    Frequency of Communication with Friends and Family: More than three times a week    Frequency of Social Gatherings with Friends and Family: More than three times a week    Attends Religious Services: More than 4 times per year    Active Member of [employer] or Organizations: No    Attends Banker Meetings: Never    Marital Status: Married  Catering manager Violence: Not At Risk (02/24/2024)   Humiliation, Afraid, Rape, and Kick questionnaire    Fear of Current or Ex-Partner: No    Emotionally Abused: No    Physically Abused: No    Sexually Abused: No    Family History  Problem  Relation Age of Onset   Hypertension Mother    Dementia Mother    Stroke Father 66   Hypertension Father    Diabetes Father    COPD Father    Cancer Maternal Grandmother        female cancer   Heart attack Maternal Grandfather     Current Outpatient Medications on File Prior to Visit  Medication Sig Dispense Refill   ALPRAZolam  (XANAX ) 0.25 MG tablet Take 1 tablet (0.25 mg total) by mouth daily as needed for anxiety. 2 tablet 0   CALCIUM  PO Take 500 mg by mouth daily.     cetirizine (ZYRTEC) 10 MG tablet Take by mouth.     Cholecalciferol (VITAMIN D3 PO) Take by mouth.     cyanocobalamin  1000 MCG tablet Take 1,000 mcg by mouth.     fexofenadine (ALLEGRA) 180 MG tablet Take 180 mg by mouth daily.     fluticasone (FLONASE) 50 MCG/ACT nasal spray Place 1 spray into both nostrils as needed.     gabapentin  (NEURONTIN ) 100 MG capsule Take 100 mg by mouth 3 (three) times daily.     hydrALAZINE  (APRESOLINE ) 25 MG tablet TAKE 1 TABLET BY MOUTH 2 TIMES DAILY AS NEEDED. 180 tablet 1   hydrochlorothiazide  (HYDRODIURIL ) 25 MG tablet Take 1 tablet (25 mg total) by mouth daily. 90 tablet 1   magnesium oxide (MAG-OX) 400 MG tablet Take 400 mg by mouth daily.     methylPREDNISolone  (MEDROL  DOSEPAK) 4 MG TBPK tablet Take as prescribed on the box 21 tablet 0   Multiple Vitamin (MULTIVITAMIN PO) Take by mouth.     Multiple Vitamins-Minerals (EMERGEN-C IMMUNE PO) Take 1 capsule by mouth as needed.     scopolamine  (TRANSDERM-SCOP) 1 MG/3DAYS Place 1 patch (1.5 mg total) onto the skin every 3 (three) days. 10 patch 12   No current facility-administered medications on file prior to visit.    Allergies  Allergen Reactions   Ciprofloxacin Hives   Flagyl [Metronidazole] Hives   Penicillins        Physical Exam Vitals requested from patient and listed below if patient had equipment and was able to obtain at home for this virtual visit: There were no vitals filed for this visit. Estimated body mass  index is 17.18 kg/m as calculated from the following:   Height as of 09/20/23: 5\' 8"  (1.727 m).   Weight as of 09/20/23: 113 lb (51.3 kg).  EKG (optional): deferred due to virtual visit  GENERAL: alert, oriented, no acute distress detected, full vision exam deferred due to pandemic and/or virtual encounter   HEENT: atraumatic, conjunttiva clear, no obvious abnormalities on inspection of external nose and ears  NECK: normal  movements of the head and neck  LUNGS: on inspection no signs of respiratory distress, breathing rate appears normal, no obvious gross SOB, gasping or wheezing  CV: no obvious cyanosis  MS: moves all visible extremities without noticeable abnormality  PSYCH/NEURO: pleasant and cooperative, no obvious depression or anxiety, speech and thought processing grossly intact, Cognitive function grossly intact  Flowsheet Row Clinical Support from 02/24/2024 in [facility] [place]  PHQ-9 Total Score 0           02/24/2024    2:08 PM 04/06/2023    1:06 PM 08/11/2022    4:04 PM 01/29/2022    8:09 AM 01/23/2022    2:28 PM  Depression screen PHQ 2/9  Decreased Interest 0 0 0 0 0  Down, Depressed, Hopeless 0 0 0 0 0  PHQ - 2 Score 0 0 0 0 0  Altered sleeping 0 1 0 1   Tired, decreased energy 0 0 0 0   Change in appetite 0 0 0 0   Feeling bad or failure about yourself  0 0 0 0   Trouble concentrating 0 0 0 0   Moving slowly or fidgety/restless 0 0 0 0   Suicidal thoughts 0 0 0 0   PHQ-9 Score 0 1 0 1   Difficult doing work/chores Not difficult at all  Not difficult at all Not difficult at all        01/29/2022    8:09 AM 08/11/2022    4:07 PM 02/10/2023   10:00 AM 04/05/2023    5:01 PM 02/24/2024    2:09 PM  Fall Risk  Falls in the past year? 0 0 0 0 0  Was there an injury with Fall? 0 0 0 0 0  Fall Risk Category Calculator 0 0 0 0 0  Fall Risk Category (Retired) Low Low     (RETIRED) Patient Fall Risk Level Low fall risk Low fall risk      Patient at Risk for Falls Due to No Fall Risks No Fall Risks No Fall Risks  No Fall Risks  Fall risk Follow up Falls evaluation completed Falls evaluation completed Falls evaluation completed Falls evaluation completed Falls evaluation completed     SUMMARY AND PLAN:  Encounter for annual wellness exam in Medicare patient   Discussed applicable health maintenance/preventive health measures and advised and referred or ordered per patient preferences: -discussed covid vaccine recs/risks -discussed hep C screening -she has osteoporosis, on prolia ; discussed Vit D - ideal levels, supplementation, calcium  adequate intake and supplementation/over supplementation  and current EB recs Health Maintenance  Topic Date Due   Hepatitis C Screening  Never done   COVID-19 Vaccine (9 - Pfizer risk 2024-25 season) 01/15/2024   INFLUENZA VACCINE  05/26/2024   DTaP/Tdap/Td (3 - Td or Tdap) 01/15/2025   Medicare Annual Wellness (AWV)  02/23/2025   Colonoscopy  05/14/2025   MAMMOGRAM  11/24/2025   Pneumonia Vaccine 32+ Years old  Completed   DEXA SCAN  Completed   Zoster Vaccines- Shingrix  Completed   HPV VACCINES  Aged Out   Meningococcal B Vaccine  Aged Out      Education and counseling on the following was provided based on the above review of health and a plan/checklist for the patient, along with additional information discussed, was provided for the patient in the patient instructions :  -Advised and counseled on a healthy lifestyle - including the importance of a healthy diet, regular physical activity, social  connections and stress management. -Reviewed patient's current diet. Congratulated on healthy habits.  A summary of a healthy diet was provided in the Patient Instructions.  -reviewed patient's current physical activity level and discussed exercise guidelines for adults. Congratulated on healthy habits. Further resources provided in patient instructions.  -Advise yearly dental visits  at minimum and regular eye exams -Advised and counseled on alcohol safe limits, risks  Follow up: see patient instructions     Patient Instructions  I really enjoyed getting to talk with you today! I am available on Tuesdays and Thursdays for virtual visits if you have any questions or concerns, or if I can be of any further assistance.   CHECKLIST FROM ANNUAL WELLNESS VISIT:  -Follow up (please call to schedule if not scheduled after visit):   -yearly for annual wellness visit with primary care office  Here is a list of your preventive care/health maintenance measures and the plan for each if any are due:  PLAN For any measures below that may be due:   Health Maintenance  Topic Date Due   Hepatitis C Screening  Never done - can do at physical if you wish    COVID-19 Vaccine (9 - Pfizer risk 2024-25 season) 01/15/2024 - can get at the pharmacy   INFLUENZA VACCINE  05/26/2024   DTaP/Tdap/Td (3 - Td or Tdap) 01/15/2025   Medicare Annual Wellness (AWV)  02/23/2025   Colonoscopy  05/14/2025   MAMMOGRAM  11/24/2025   Pneumonia Vaccine 71+ Years old  Completed   DEXA SCAN  Completed   Zoster Vaccines- Shingrix  Completed   HPV VACCINES  Aged Out   Meningococcal B Vaccine  Aged Out    -See a dentist at least yearly  -Get your eyes checked and then per your eye specialist's recommendations  -Other issues addressed today:   -I have included below further information regarding a healthy whole foods based diet, physical activity guidelines for adults, stress management and opportunities for social connections. I hope you find this information useful.   -----------------------------------------------------------------------------------------------------------------------------------------------------------------------------------------------------------------------------------------------------------    NUTRITION: -eat real food: lots of colorful vegetables (half the plate) and  fruits -5-7 servings of vegetables and fruits per day (fresh or steamed is best), exp. 2 servings of vegetables with lunch and dinner and 2 servings of fruit per day. Berries and greens such as kale and collards are great choices.  -consume on a regular basis:  fresh fruits, fresh veggies, fish, nuts, seeds, healthy oils (such as olive oil, avocado oil), whole grains (make sure for bread/pasta/crackers/etc., that the first ingredient on label contains the word "whole"), legumes. -can eat small amounts of dairy and lean meat (no larger than the palm of your hand), but avoid processed meats such as ham, bacon, lunch meat, etc. -drink water -try to avoid fast food and pre-packaged foods, processed meat, ultra processed foods/beverages (donuts, candy, etc.) -most experts advise limiting sodium to < 2300mg  per day, should limit further is any chronic conditions such as high blood pressure, heart disease, diabetes, etc. The American Heart Association advised that < 1500mg  is is ideal -try to avoid foods/beverages that contain any ingredients with names you do not recognize  -try to avoid foods/beverages  with added sugar or sweeteners/sweets  -try to avoid sweet drinks (including diet drinks): soda, juice, Gatorade, sweet tea, power drinks, diet drinks -try to avoid white rice, white bread, pasta (unless whole grain)  EXERCISE GUIDELINES FOR ADULTS: -if you wish to increase your physical activity, do so gradually  and with the approval of your doctor -STOP and seek medical care immediately if you have any chest pain, chest discomfort or trouble breathing when starting or increasing exercise  -move and stretch your body, legs, feet and arms when sitting for long periods -Physical activity guidelines for optimal health in adults: -get at least 150 minutes per week of moderate exercise (can talk, but not sing); this is about 20-30 minutes of sustained activity 5-7 days per week or two 10-15 minute episodes of  sustained activity 5-7 days per week -do some muscle building/resistance training/strength training at least 2 days per week  -balance exercises 3+ days per week:   Stand somewhere where you have something sturdy to hold onto if you lose balance    1) lift up on toes, then back down, start with 5x per day and work up to 20x   2) stand and lift one leg straight out to the side so that foot is a few inches of the floor, start with 5x each side and work up to 20x each side   3) stand on one foot, start with 5 seconds each side and work up to 20 seconds on each side  If you need ideas or help with getting more active:  -Silver sneakers https://tools.silversneakers.com  -Walk with a Doc: http://www.duncan-williams.com/  -try to include resistance (weight lifting/strength building) and balance exercises twice per week: or the following link for ideas: http://castillo-powell.com/  BuyDucts.dk  STRESS MANAGEMENT: -can try meditating, or just sitting quietly with deep breathing while intentionally relaxing all parts of your body for 5 minutes daily -if you need further help with stress, anxiety or depression please follow up with your primary doctor or contact the wonderful folks at [facility]: [phone]  SOCIAL CONNECTIONS: -options in [place] if you wish to engage in more social and exercise related activities:  -Silver sneakers https://tools.silversneakers.com  -Walk with a Doc: http://www.duncan-williams.com/  -Check out the [facility] Active Adults 50+ section on the [place] of [employer] (hiking clubs, book clubs, cards and games, chess, exercise classes, aquatic classes and much more) - see the website for details: https://www.[place].gov/departments/parks-recreation/active-adults50  -YouTube has lots of exercise videos for different ages and abilities as well  -[facility] (a variety of indoor and outdoor inperson activities for adults). ([phone])135-6692. [address]  -Virtual Online Classes (a variety of topics): see seniorplanet.org or call [phone]  -consider volunteering at a school, hospice center, church, senior center or elsewhere            Maurie Southern, DO

## 2024-02-24 NOTE — Patient Instructions (Signed)
 I really enjoyed getting to talk with you today! I am available on Tuesdays and Thursdays for virtual visits if you have any questions or concerns, or if I can be of any further assistance.   CHECKLIST FROM ANNUAL WELLNESS VISIT:  -Follow up (please call to schedule if not scheduled after visit):   -yearly for annual wellness visit with primary care office  Here is a list of your preventive care/health maintenance measures and the plan for each if any are due:  PLAN For any measures below that may be due:   Health Maintenance  Topic Date Due   Hepatitis C Screening  Never done - can do at physical if you wish    COVID-19 Vaccine (9 - Pfizer risk 2024-25 season) 01/15/2024 - can get at the pharmacy   INFLUENZA VACCINE  05/26/2024   DTaP/Tdap/Td (3 - Td or Tdap) 01/15/2025   Medicare Annual Wellness (AWV)  02/23/2025   Colonoscopy  05/14/2025   MAMMOGRAM  11/24/2025   Pneumonia Vaccine 103+ Years old  Completed   DEXA SCAN  Completed   Zoster Vaccines- Shingrix  Completed   HPV VACCINES  Aged Out   Meningococcal B Vaccine  Aged Out    -See a dentist at least yearly  -Get your eyes checked and then per your eye specialist's recommendations  -Other issues addressed today:   -I have included below further information regarding a healthy whole foods based diet, physical activity guidelines for adults, stress management and opportunities for social connections. I hope you find this information useful.   -----------------------------------------------------------------------------------------------------------------------------------------------------------------------------------------------------------------------------------------------------------    NUTRITION: -eat real food: lots of colorful vegetables (half the plate) and fruits -5-7 servings of vegetables and fruits per day (fresh or steamed is best), exp. 2 servings of vegetables with lunch and dinner and 2 servings of  fruit per day. Berries and greens such as kale and collards are great choices.  -consume on a regular basis:  fresh fruits, fresh veggies, fish, nuts, seeds, healthy oils (such as olive oil, avocado oil), whole grains (make sure for bread/pasta/crackers/etc., that the first ingredient on label contains the word "whole"), legumes. -can eat small amounts of dairy and lean meat (no larger than the palm of your hand), but avoid processed meats such as ham, bacon, lunch meat, etc. -drink water -try to avoid fast food and pre-packaged foods, processed meat, ultra processed foods/beverages (donuts, candy, etc.) -most experts advise limiting sodium to < 2300mg  per day, should limit further is any chronic conditions such as high blood pressure, heart disease, diabetes, etc. The American Heart Association advised that < 1500mg  is is ideal -try to avoid foods/beverages that contain any ingredients with names you do not recognize  -try to avoid foods/beverages  with added sugar or sweeteners/sweets  -try to avoid sweet drinks (including diet drinks): soda, juice, Gatorade, sweet tea, power drinks, diet drinks -try to avoid white rice, white bread, pasta (unless whole grain)  EXERCISE GUIDELINES FOR ADULTS: -if you wish to increase your physical activity, do so gradually and with the approval of your doctor -STOP and seek medical care immediately if you have any chest pain, chest discomfort or trouble breathing when starting or increasing exercise  -move and stretch your body, legs, feet and arms when sitting for long periods -Physical activity guidelines for optimal health in adults: -get at least 150 minutes per week of moderate exercise (can talk, but not sing); this is about 20-30 minutes of sustained activity 5-7 days per week or two  10-15 minute episodes of sustained activity 5-7 days per week -do some muscle building/resistance training/strength training at least 2 days per week  -balance exercises 3+  days per week:   Stand somewhere where you have something sturdy to hold onto if you lose balance    1) lift up on toes, then back down, start with 5x per day and work up to 20x   2) stand and lift one leg straight out to the side so that foot is a few inches of the floor, start with 5x each side and work up to 20x each side   3) stand on one foot, start with 5 seconds each side and work up to 20 seconds on each side  If you need ideas or help with getting more active:  -Silver sneakers https://tools.silversneakers.com  -Walk with a Doc: http://www.duncan-williams.com/  -try to include resistance (weight lifting/strength building) and balance exercises twice per week: or the following link for ideas: http://castillo-powell.com/  BuyDucts.dk  STRESS MANAGEMENT: -can try meditating, or just sitting quietly with deep breathing while intentionally relaxing all parts of your body for 5 minutes daily -if you need further help with stress, anxiety or depression please follow up with your primary doctor or contact the wonderful folks at [facility]: [phone]  SOCIAL CONNECTIONS: -options in [place] if you wish to engage in more social and exercise related activities:  -Silver sneakers https://tools.silversneakers.com  -Walk with a Doc: http://www.duncan-williams.com/  -Check out the [facility] Active Adults 50+ section on the [place] of [employer] (hiking clubs, book clubs, cards and games, chess, exercise classes, aquatic classes and much more) - see the website for details: https://www.[place].gov/departments/parks-recreation/active-adults50  -YouTube has lots of exercise videos for different ages and abilities as well  -[facility] (a variety of indoor and outdoor inperson activities for adults). [phone]. [address]  -Virtual Online Classes (a  variety of topics): see seniorplanet.org or call [phone]  -consider volunteering at a school, hospice center, church, senior center or elsewhere

## 2024-02-24 NOTE — Progress Notes (Signed)
 Patient unable to obtain vital signs due to telehealth visit

## 2024-02-28 ENCOUNTER — Other Ambulatory Visit: Payer: Self-pay | Admitting: Internal Medicine

## 2024-03-01 DIAGNOSIS — M542 Cervicalgia: Secondary | ICD-10-CM | POA: Diagnosis not present

## 2024-03-14 ENCOUNTER — Ambulatory Visit (INDEPENDENT_AMBULATORY_CARE_PROVIDER_SITE_OTHER): Payer: Medicare HMO | Admitting: Otolaryngology

## 2024-03-14 VITALS — BP 115/58 | HR 70

## 2024-03-14 DIAGNOSIS — H6122 Impacted cerumen, left ear: Secondary | ICD-10-CM

## 2024-03-14 DIAGNOSIS — M542 Cervicalgia: Secondary | ICD-10-CM | POA: Diagnosis not present

## 2024-03-14 NOTE — Progress Notes (Signed)
 Patient ID: Taffie  Nicole Snow, female   DOB: 08/18/1951, 73 y.o.   MRN: [id number]  Follow up: Recurrent left ear cerumen impaction, stenotic ear canal   Procedure: Left ear cerumen disimpaction.     Indication: Recurrent cerumen impaction, resulting in ear discomfort and conductive hearing loss.     Description: The patient is placed supine on the exam table.  Under the operating microscope, the left ear canal is examined and is noted to be completely impacted with cerumen.  The left ear canal is stenotic and collapsible.  The cerumen is carefully removed with a combination of suction catheters, cerumen curette, and alligator forceps.  After the cerumen removal, the ear canal and tympanic membrane are noted to be normal.  No middle ear effusion is noted.  No significant impaction is noted on the right side.  The patient tolerated the procedure well.   Follow-up care:  The patient will follow up in 6 months.

## 2024-03-15 ENCOUNTER — Ambulatory Visit (INDEPENDENT_AMBULATORY_CARE_PROVIDER_SITE_OTHER): Admitting: Internal Medicine

## 2024-03-15 ENCOUNTER — Other Ambulatory Visit (HOSPITAL_COMMUNITY): Payer: Self-pay

## 2024-03-15 ENCOUNTER — Ambulatory Visit: Payer: Self-pay | Admitting: Internal Medicine

## 2024-03-15 ENCOUNTER — Encounter: Payer: Self-pay | Admitting: Internal Medicine

## 2024-03-15 VITALS — BP 130/80 | HR 53 | Temp 98.5°F | Ht 68.0 in | Wt 115.0 lb

## 2024-03-15 DIAGNOSIS — E782 Mixed hyperlipidemia: Secondary | ICD-10-CM | POA: Diagnosis not present

## 2024-03-15 DIAGNOSIS — I10 Essential (primary) hypertension: Secondary | ICD-10-CM | POA: Diagnosis not present

## 2024-03-15 DIAGNOSIS — Z0000 Encounter for general adult medical examination without abnormal findings: Secondary | ICD-10-CM | POA: Diagnosis not present

## 2024-03-15 DIAGNOSIS — H43812 Vitreous degeneration, left eye: Secondary | ICD-10-CM | POA: Diagnosis not present

## 2024-03-15 DIAGNOSIS — H2513 Age-related nuclear cataract, bilateral: Secondary | ICD-10-CM | POA: Diagnosis not present

## 2024-03-15 DIAGNOSIS — M810 Age-related osteoporosis without current pathological fracture: Secondary | ICD-10-CM | POA: Diagnosis not present

## 2024-03-15 LAB — CBC WITH DIFFERENTIAL/PLATELET
Basophils Absolute: 0 10*3/uL (ref 0.0–0.1)
Basophils Relative: 0.7 % (ref 0.0–3.0)
Eosinophils Absolute: 0.1 10*3/uL (ref 0.0–0.7)
Eosinophils Relative: 1.9 % (ref 0.0–5.0)
HCT: 44.5 % (ref 36.0–46.0)
Hemoglobin: 15.2 g/dL — ABNORMAL HIGH (ref 12.0–15.0)
Lymphocytes Relative: 31.1 % (ref 12.0–46.0)
Lymphs Abs: 1.4 10*3/uL (ref 0.7–4.0)
MCHC: 34.2 g/dL (ref 30.0–36.0)
MCV: 95.1 fl (ref 78.0–100.0)
Monocytes Absolute: 0.3 10*3/uL (ref 0.1–1.0)
Monocytes Relative: 6.8 % (ref 3.0–12.0)
Neutro Abs: 2.6 10*3/uL (ref 1.4–7.7)
Neutrophils Relative %: 59.5 % (ref 43.0–77.0)
Platelets: 152 10*3/uL (ref 150.0–400.0)
RBC: 4.68 Mil/uL (ref 3.87–5.11)
RDW: 12.5 % (ref 11.5–15.5)
WBC: 4.4 10*3/uL (ref 4.0–10.5)

## 2024-03-15 LAB — LIPID PANEL
Cholesterol: 239 mg/dL — ABNORMAL HIGH (ref 0–200)
HDL: 98.3 mg/dL (ref >39.00)
LDL Cholesterol: 128 mg/dL — ABNORMAL HIGH (ref 0–99)
NonHDL: 140.6
Total CHOL/HDL Ratio: 2
Triglycerides: 63 mg/dL (ref 0.0–149.0)
VLDL: 12.6 mg/dL (ref 0.0–40.0)

## 2024-03-15 LAB — COMPREHENSIVE METABOLIC PANEL WITH GFR
ALT: 27 U/L (ref 0–35)
AST: 31 U/L (ref 0–37)
Albumin: 4.5 g/dL (ref 3.5–5.2)
Alkaline Phosphatase: 33 U/L — ABNORMAL LOW (ref 39–117)
BUN: 14 mg/dL (ref 6–23)
CO2: 28 meq/L (ref 19–32)
Calcium: 9.7 mg/dL (ref 8.4–10.5)
Chloride: 98 meq/L (ref 96–112)
Creatinine, Ser: 0.66 mg/dL (ref 0.40–1.20)
GFR: 88.23 mL/min (ref >60.00)
Glucose, Bld: 97 mg/dL (ref 70–99)
Potassium: 3.9 meq/L (ref 3.5–5.1)
Sodium: 132 meq/L — ABNORMAL LOW (ref 135–145)
Total Bilirubin: 1.3 mg/dL — ABNORMAL HIGH (ref 0.2–1.2)
Total Protein: 6.6 g/dL (ref 6.0–8.3)

## 2024-03-15 LAB — VITAMIN D 25 HYDROXY (VIT D DEFICIENCY, FRACTURES): VITD: 51.14 ng/mL (ref 30.00–100.00)

## 2024-03-15 LAB — VITAMIN B12: Vitamin B-12: 562 pg/mL (ref 211–911)

## 2024-03-15 LAB — TSH: TSH: 1.48 u[IU]/mL (ref 0.35–5.50)

## 2024-03-15 MED ORDER — DENOSUMAB 60 MG/ML ~~LOC~~ SOSY
60.0000 mg | PREFILLED_SYRINGE | Freq: Once | SUBCUTANEOUS | Status: AC
  Administered 2024-03-15: 60 mg via SUBCUTANEOUS

## 2024-03-15 NOTE — Progress Notes (Signed)
 Established Patient Office Visit     CC/Reason for Visit: Annual preventive exam  HPI: Nicole  Taneil Snow is a 73 y.o. female who is coming in today for the above mentioned reasons. Past Medical History is significant for: Hypertension, hyperlipidemia and osteoporosis.  Due for Prolia  today.  Will be moving permanently to [place] .  Has routine eye and dental care.  Immunizations and cancer screenings are up-to-date.   Past Medical/Surgical History: Past Medical History:  Diagnosis Date   Allergy    C. difficile colitis 08/2016   HTN (hypertension)     Past Surgical History:  Procedure Laterality Date   CHOLECYSTECTOMY     COLONOSCOPY  02/27/2009   T A polyp   HYSTEROSCOPY WITH D & C  1992   KNEE ARTHROSCOPY     LASIK Bilateral    REPLACEMENT TOTAL HIP W/  RESURFACING IMPLANTS Bilateral    TENDON REPAIR Right 1982   TENDON REPAIR     left hand ring finge at age 71 yr old   TONSILLECTOMY     WISDOM TOOTH EXTRACTION      Social History:  reports that she has never smoked. She has never used smokeless tobacco. She reports current alcohol use of about 7.0 standard drinks of alcohol per week. She reports that she does not use drugs.  Allergies: Allergies  Allergen Reactions   Ciprofloxacin Hives   Flagyl [Metronidazole] Hives   Penicillins     Family History:  Family History  Problem Relation Age of Onset   Hypertension Mother    Dementia Mother    Stroke Father 55   Hypertension Father    Diabetes Father    COPD Father    Cancer Maternal Grandmother        female cancer   Heart attack Maternal Grandfather      Current Outpatient Medications:    ALPRAZolam  (XANAX ) 0.25 MG tablet, Take 1 tablet (0.25 mg total) by mouth daily as needed for anxiety., Disp: 2 tablet, Rfl: 0   CALCIUM  PO, Take 500 mg by mouth daily., Disp: , Rfl:    cetirizine (ZYRTEC) 10 MG tablet, Take by mouth., Disp: , Rfl:    Cholecalciferol (VITAMIN D3 PO), Take by mouth., Disp:  , Rfl:    cyanocobalamin  1000 MCG tablet, Take 1,000 mcg by mouth., Disp: , Rfl:    denosumab  (PROLIA ) 60 MG/ML SOSY injection, INJECT 1 SYRINGE UNDER THE SKIN ONCE EVERY 6 MONTHS, Disp: 1 mL, Rfl: 0   fexofenadine (ALLEGRA) 180 MG tablet, Take 180 mg by mouth daily., Disp: , Rfl:    fluticasone (FLONASE) 50 MCG/ACT nasal spray, Place 1 spray into both nostrils as needed., Disp: , Rfl:    gabapentin  (NEURONTIN ) 100 MG capsule, Take 100 mg by mouth 3 (three) times daily., Disp: , Rfl:    hydrALAZINE  (APRESOLINE ) 25 MG tablet, TAKE 1 TABLET BY MOUTH 2 TIMES DAILY AS NEEDED., Disp: 180 tablet, Rfl: 1   hydrochlorothiazide  (HYDRODIURIL ) 25 MG tablet, Take 1 tablet (25 mg total) by mouth daily., Disp: 90 tablet, Rfl: 1   magnesium oxide (MAG-OX) 400 MG tablet, Take 400 mg by mouth daily., Disp: , Rfl:    methylPREDNISolone  (MEDROL  DOSEPAK) 4 MG TBPK tablet, Take as prescribed on the box, Disp: 21 tablet, Rfl: 0   Multiple Vitamin (MULTIVITAMIN PO), Take by mouth., Disp: , Rfl:    Multiple Vitamins-Minerals (EMERGEN-C IMMUNE PO), Take 1 capsule by mouth as needed., Disp: , Rfl:    scopolamine  (TRANSDERM-SCOP)  1 MG/3DAYS, Place 1 patch (1.5 mg total) onto the skin every 3 (three) days., Disp: 10 patch, Rfl: 12  Review of Systems:  Negative unless indicated in HPI.   Physical Exam: Vitals:   03/15/24 0933  BP: 130/80  Pulse: (!) 53  Temp: 98.5 F (36.9 C)  TempSrc: Oral  SpO2: 96%  Weight: 115 lb (52.2 kg)  Height: 5\' 8"  (1.727 m)    Body mass index is 17.49 kg/m.   Physical Exam Vitals reviewed.  Constitutional:      General: She is not in acute distress.    Appearance: Normal appearance. She is not ill-appearing, toxic-appearing or diaphoretic.  HENT:     Head: Normocephalic.     Right Ear: Tympanic membrane, ear canal and external ear normal. There is no impacted cerumen.     Left Ear: Tympanic membrane, ear canal and external ear normal. There is no impacted cerumen.     Nose:  Nose normal.     Mouth/Throat:     Mouth: Mucous membranes are moist.     Pharynx: Oropharynx is clear. No oropharyngeal exudate or posterior oropharyngeal erythema.  Eyes:     General: No scleral icterus.       Right eye: No discharge.        Left eye: No discharge.     Conjunctiva/sclera: Conjunctivae normal.     Pupils: Pupils are equal, round, and reactive to light.  Neck:     Vascular: No carotid bruit.  Cardiovascular:     Rate and Rhythm: Normal rate and regular rhythm.     Pulses: Normal pulses.     Heart sounds: Normal heart sounds.  Pulmonary:     Effort: Pulmonary effort is normal. No respiratory distress.     Breath sounds: Normal breath sounds.  Abdominal:     General: Abdomen is flat. Bowel sounds are normal.     Palpations: Abdomen is soft.  Musculoskeletal:        General: Normal range of motion.     Cervical back: Normal range of motion.  Skin:    General: Skin is warm and dry.  Neurological:     General: No focal deficit present.     Mental Status: She is alert and oriented to person, place, and time. Mental status is at baseline.  Psychiatric:        Mood and Affect: Mood normal.        Behavior: Behavior normal.        Thought Content: Thought content normal.        Judgment: Judgment normal.      Impression and Plan:  Encounter for preventive health examination  Primary hypertension -     CBC with Differential/Platelet; Future -     Comprehensive metabolic panel with GFR; Future  Mixed hyperlipidemia -     Lipid panel; Future  Age-related osteoporosis without current pathological fracture -     TSH; Future -     Vitamin B12; Future -     VITAMIN D  25 Hydroxy (Vit-D Deficiency, Fractures); Future   -Recommend routine eye and dental care. -Healthy lifestyle discussed in detail. -Labs to be updated today. -Prostate cancer screening: N/A Health Maintenance  Topic Date Due   Hepatitis C Screening  Never done   COVID-19 Vaccine (9 - Pfizer  risk 2024-25 season) 01/15/2024   Flu Shot  05/26/2024   DTaP/Tdap/Td vaccine (3 - Td or Tdap) 01/15/2025   Medicare Annual Wellness Visit  02/23/2025  Colon Cancer Screening  05/14/2025   Mammogram  11/24/2025   Pneumonia Vaccine  Completed   DEXA scan (bone density measurement)  Completed   Zoster (Shingles) Vaccine  Completed   HPV Vaccine  Aged Out   Meningitis B Vaccine  Aged Out     - Prolia  given in office today.     Marguerita Shih, MD [facility]

## 2024-03-15 NOTE — Addendum Note (Signed)
 Addended by: Nicolina Barrios B on: 03/15/2024 10:13 AM   Modules accepted: Orders

## 2024-05-08 MED ORDER — BLOOD PRESSURE KIT: 1.0000 | PACK | Freq: Every day | 0 refills | Status: AC

## 2024-06-20 ENCOUNTER — Telehealth (INDEPENDENT_AMBULATORY_CARE_PROVIDER_SITE_OTHER): Payer: Self-pay

## 2024-06-20 ENCOUNTER — Encounter: Payer: Self-pay | Admitting: Internal Medicine

## 2024-06-20 NOTE — Telephone Encounter (Signed)
 Patient left a message, she stated she is traveling to [place] , she has impacted wax and wondered if there was a doctor that Karis could refer her to there to have this taken care of.  Please call her at [phone]

## 2024-07-12 ENCOUNTER — Other Ambulatory Visit: Payer: Self-pay

## 2024-07-12 DIAGNOSIS — M810 Age-related osteoporosis without current pathological fracture: Secondary | ICD-10-CM

## 2024-07-12 MED ORDER — DENOSUMAB 60 MG/ML ~~LOC~~ SOSY: 60.0000 mg | PREFILLED_SYRINGE | SUBCUTANEOUS | Status: DC

## 2024-07-12 NOTE — Progress Notes (Signed)
 Pt on bone density report. Order placed for PA.

## 2024-08-14 ENCOUNTER — Encounter: Payer: Self-pay | Admitting: Internal Medicine

## 2024-08-14 DIAGNOSIS — I10 Essential (primary) hypertension: Secondary | ICD-10-CM

## 2024-08-14 MED ORDER — HYDROCHLOROTHIAZIDE 25 MG PO TABS: 25.0000 mg | ORAL_TABLET | Freq: Every day | ORAL | 1 refills | Status: AC

## 2024-08-16 ENCOUNTER — Telehealth: Payer: Self-pay

## 2024-08-16 ENCOUNTER — Other Ambulatory Visit (HOSPITAL_COMMUNITY): Payer: Self-pay

## 2024-08-16 NOTE — Telephone Encounter (Signed)
 Nicole Snow

## 2024-08-16 NOTE — Telephone Encounter (Signed)
 Prolia  VOB initiated via [email or web address]  Next Prolia  inj DUE: 09/15/24

## 2024-08-16 NOTE — Telephone Encounter (Signed)
 MEDICAL PA SUBMITTED VIA [employer]OV[employer]IX. AUTH #: [id number]   PHARMACY: $117.41

## 2024-08-17 NOTE — Telephone Encounter (Signed)
 Pt ready for scheduling for PROLIA  on or after : 09/15/24  Option# 1: Buy/Bill (Office supplied medication)  Out-of-pocket cost due at time of clinic visit: $377  Number of injection/visits approved: 2  Primary: [employer]-MEDICARE Prolia  co-insurance: 20% Admin fee co-insurance: $45  Secondary: --- Prolia  co-insurance:  Admin fee co-insurance:   Medical Benefit Details: Date Benefits were checked: 08/16/24 Deductible: NO/ Coinsurance: 20%/ Admin Fee: $45  Prior Auth: APPROVED PA# [id number] Expiration Date: 08/16/24-08/16/25  # of doses approved: 2 ----------------------------------------------------------------------- Option# 2- Med Obtained from pharmacy:  Pharmacy benefit: Copay $882.41 (Paid to pharmacy) Admin Fee: $45 (Pay at clinic)  Prior Auth: N/A PA# Expiration Date:   # of doses approved:   If patient wants fill through the pharmacy benefit please send prescription to: [facility], and include estimated need by date in rx notes. Pharmacy will ship medication directly to the office.  Patient NOT eligible for Prolia  Copay Card. Copay Card can make patient's cost as little as $25. Link to apply: https://www.[email or web address].[email or web address]/copay  ** This summary of benefits is an estimation of the patient's out-of-pocket cost. Exact cost may very based on individual plan coverage.

## 2024-08-21 ENCOUNTER — Telehealth: Payer: Self-pay

## 2024-08-21 NOTE — Telephone Encounter (Signed)
 Spoke with patient regarding Prolia  injection and scheduling. Patient notified writer she has relocated to [place]  and will find a new PCP there and receive care. Wished patient well.

## 2024-09-19 ENCOUNTER — Encounter: Payer: Self-pay | Admitting: Internal Medicine

## 2024-11-07 ENCOUNTER — Encounter: Payer: Self-pay | Admitting: Internal Medicine

## 2024-11-09 ENCOUNTER — Other Ambulatory Visit: Payer: Self-pay

## 2024-11-09 DIAGNOSIS — M810 Age-related osteoporosis without current pathological fracture: Secondary | ICD-10-CM

## 2024-11-09 MED ORDER — DENOSUMAB 60 MG/ML ~~LOC~~ SOSY: 60.0000 mg | PREFILLED_SYRINGE | Freq: Once | SUBCUTANEOUS | Status: AC

## 2024-11-09 NOTE — Telephone Encounter (Signed)
 I have sent the Prolia  off again for PA. Per the last time I spoke with the patient, she had moved to [place]  and was going to seek treatment there. Once PA is received we can contact her to schedule.

## 2024-11-09 NOTE — Telephone Encounter (Addendum)
 Lmom for pt callback to sch

## 2024-11-09 NOTE — Addendum Note (Signed)
 Addended by: DIONISIO COLLIE PARAS on: 11/09/2024 10:23 AM   Modules accepted: Orders

## 2024-11-14 ENCOUNTER — Telehealth: Payer: Self-pay | Admitting: *Deleted

## 2024-11-14 NOTE — Telephone Encounter (Signed)
 Patient would like her Prolia  injection the same time as her physical 04/11/25

## 2025-03-01 ENCOUNTER — Encounter: Admitting: Internal Medicine

## 2025-04-11 ENCOUNTER — Encounter: Admitting: Internal Medicine
